# Patient Record
Sex: Male | Born: 1970 | Race: White | Hispanic: No | Marital: Single | Smoking: Current every day smoker
Health system: Southern US, Community
[De-identification: ages and names within clinical notes are randomized; demographics above are authoritative.]

## PROBLEM LIST (undated history)

## (undated) DIAGNOSIS — E119 Type 2 diabetes mellitus without complications: Secondary | ICD-10-CM

## (undated) DIAGNOSIS — I10 Essential (primary) hypertension: Secondary | ICD-10-CM

---

## 2010-07-20 ENCOUNTER — Ambulatory Visit: Payer: Self-pay | Admitting: Psychiatry

## 2010-07-20 ENCOUNTER — Inpatient Hospital Stay (HOSPITAL_COMMUNITY): Admission: AD | Admit: 2010-07-20 | Discharge: 2010-07-21 | Payer: Self-pay | Admitting: Psychiatry

## 2014-11-11 ENCOUNTER — Encounter (HOSPITAL_COMMUNITY): Payer: Self-pay | Admitting: Cardiology

## 2014-11-11 ENCOUNTER — Emergency Department (HOSPITAL_COMMUNITY)
Admission: EM | Admit: 2014-11-11 | Discharge: 2014-11-11 | Disposition: A | Discharge status: Home / Self Care | Payer: Self-pay | Attending: Emergency Medicine | Admitting: Emergency Medicine

## 2014-11-11 DIAGNOSIS — G479 Sleep disorder, unspecified: Secondary | ICD-10-CM | POA: Insufficient documentation

## 2014-11-11 DIAGNOSIS — Z720 Tobacco use: Secondary | ICD-10-CM | POA: Insufficient documentation

## 2014-11-11 DIAGNOSIS — K029 Dental caries, unspecified: Secondary | ICD-10-CM | POA: Insufficient documentation

## 2014-11-11 DIAGNOSIS — I10 Essential (primary) hypertension: Secondary | ICD-10-CM | POA: Insufficient documentation

## 2014-11-11 DIAGNOSIS — Z590 Homelessness: Secondary | ICD-10-CM | POA: Insufficient documentation

## 2014-11-11 DIAGNOSIS — E119 Type 2 diabetes mellitus without complications: Secondary | ICD-10-CM | POA: Insufficient documentation

## 2014-11-11 DIAGNOSIS — K0889 Other specified disorders of teeth and supporting structures: Secondary | ICD-10-CM

## 2014-11-11 DIAGNOSIS — K088 Other specified disorders of teeth and supporting structures: Secondary | ICD-10-CM | POA: Insufficient documentation

## 2014-11-11 HISTORY — DX: Essential (primary) hypertension: I10

## 2014-11-11 HISTORY — DX: Type 2 diabetes mellitus without complications: E11.9

## 2014-11-11 MED ORDER — AMOXICILLIN 500 MG PO CAPS: 500.0000 mg | ORAL_CAPSULE | Freq: Three times a day (TID) | ORAL | Status: DC

## 2014-11-11 MED ORDER — BIOTENE DRY MOUTH MT LIQD
15.0000 mL | OROMUCOSAL | Status: DC | PRN
  Administered 2014-11-11: 15 mL via OROMUCOSAL

## 2014-11-11 MED ORDER — IBUPROFEN 400 MG PO TABS
800.0000 mg | ORAL_TABLET | Freq: Once | ORAL | Status: AC
  Administered 2014-11-11: 800 mg via ORAL
  Filled 2014-11-11: qty 2

## 2014-11-11 MED ORDER — IBUPROFEN 600 MG PO TABS: 600.0000 mg | ORAL_TABLET | Freq: Four times a day (QID) | ORAL | Status: DC | PRN

## 2014-11-11 MED ORDER — AMOXICILLIN 500 MG PO CAPS
1000.0000 mg | ORAL_CAPSULE | Freq: Once | ORAL | Status: AC
  Administered 2014-11-11: 1000 mg via ORAL
  Filled 2014-11-11: qty 2

## 2014-11-11 NOTE — Discharge Instructions (Signed)
Dental Pain °A tooth ache may be caused by cavities (tooth decay). Cavities expose the nerve of the tooth to air and hot or cold temperatures. It may come from an infection or abscess (also called a boil or furuncle) around your tooth. It is also often caused by dental caries (tooth decay). This causes the pain you are having. °DIAGNOSIS  °Your caregiver can diagnose this problem by exam. °TREATMENT  °· If caused by an infection, it may be treated with medications which kill germs (antibiotics) and pain medications as prescribed by your caregiver. Take medications as directed. °· Only take over-the-counter or prescription medicines for pain, discomfort, or fever as directed by your caregiver. °· Whether the tooth ache today is caused by infection or dental disease, you should see your dentist as soon as possible for further care. °SEEK MEDICAL CARE IF: °The exam and treatment you received today has been provided on an emergency basis only. This is not a substitute for complete medical or dental care. If your problem worsens or new problems (symptoms) appear, and you are unable to meet with your dentist, call or return to this location. °SEEK IMMEDIATE MEDICAL CARE IF:  °· You have a fever. °· You develop redness and swelling of your face, jaw, or neck. °· You are unable to open your mouth. °· You have severe pain uncontrolled by pain medicine. °MAKE SURE YOU:  °· Understand these instructions. °· Will watch your condition. °· Will get help right away if you are not doing well or get worse. °Document Released: 09/29/2005 Document Revised: 12/22/2011 Document Reviewed: 05/17/2008 °ExitCare® Patient Information ©2015 [employer]. This information is not intended to replace advice given to you by your health care provider. Make sure you discuss any questions you have with your health care provider. ° ° °RESOURCE GUIDE ° °Chronic Pain Problems: °Contact [facility]  297-2271 °Patients need to be  referred by their primary care doctor. ° °Insufficient Money for Medicine: °Contact [employer] Way:  call "211" or [facility] 271-5999. ° °No Primary Care Doctor: °Call [facility]  832-8000 - can help you locate a primary care doctor that  accepts your insurance, provides certain services, etc. °Physician Referral Service- 1-800-533-3463 ° °Agencies that provide inexpensive medical care: °[facility]  832-8035 °[facility]  832-7272 °[facility]  [phone] °[facility]  832-4777 °[facility]anned Parenthood  373-0678 °[facility]  272-1050 ° °Medicaid-accepting [place] Providers: °[facility]- [address] ° 641-2100, Mon-Fri 9am-7pm, Sat 9am-1pm °[facility]- [address], [address] ° [phone] °[facility]- [address] ° [phone] °[facility]- [address] [place] [address] ° [phone] °[facility]- [address], [phone] ° Only accepts [place] Access Medicaid patients after they have their name  applied to their card ° °Self Pay (no insurance) in [place]: °Sickle Cell Patients: Dr Eric Dean, [facility] ° [address], [phone] °[facility]- [address] ° [phone] °      -      [facility]- [address] °      -     [facility]- see information above (Speak to Pam H if you do not have insurance) °      -  [facility]- [address], [phone] °      -  [facility] [place]- [address],  [phone] °      -  [facility]- [address] [place] [address], [phone] °      -  Dr Osei-Bonsu-  [address], [place], [phone] °      -  [facility]- [address], [phone] °      -  [facility]- [address] [place] [address], [phone], also [address], [phone] °      -     [facility]- [address], [phone], 1st & 3rd Saturday   every month, 10am-1pm ° °1) Find a Doctor and Pay Out of Pocket °Although you won't have to find out who is covered by your insurance plan, it is a good idea to ask around and get recommendations. You will then need to call the office and see if the doctor you have chosen will accept you as a new patient and what types of options they offer for patients who are self-pay. Some doctors offer discounts or will set up payment plans for their patients who do not have insurance, but you will need to ask so you aren't surprised when you get to your appointment. ° °2) Contact Your Local Health Department °Not all health departments have doctors that can see patients for sick visits, but many do, so it is worth a call to see if yours does. If you don't know where your local health department is, you can check in your phone book. The CDC also has a tool to help you locate your state's health department, and many state websites also have listings of all of their local health departments. ° °3) Find a Walk-in Clinic °If your illness is not likely to be very severe or complicated, you may want to try a walk in clinic. These are popping up all over the country in pharmacies, drugstores, and shopping centers. They're usually staffed by nurse practitioners or physician assistants that have been trained to treat common illnesses and complaints. They're usually fairly quick and inexpensive. However, if you have serious medical issues or chronic medical problems, these are probably not your best option ° °STD Testing °[place] [facility] [place], [facility], [address], [place], phone [phone] or 1-877-539-9860.  Monday - Friday, call for an appointment. °[place] [facility] [place], [facility], [address], [place], phone [phone] or 1-877-539-9860.  Monday - Friday, call for an  appointment. ° °Abuse/Neglect: °[place] Child Abuse Hotline (336) 641-3795 °[place] Child Abuse Hotline 800-378-5315 (After Hours) ° °Emergency Shelter:   [facility] (336) 271-5985 ° °Maternity Homes: °[facility] (336) 275-9566 °[facility] (704) 372-4663 ° °MRSA Hotline #:   832-7006 ° °[place] Resources ° °[facility] [place]  [employer] [place] [facility] °[address]                 [address]         [address]  °[place]                                               [place]                                [place] °Phone:  349-3220                                  Phone:  [phone]                   Phone:  342-8140 ° °[facility], 342-8316 °[place] [facility] - [facility]- 1-888-581-9988 °      -     [facility] in [place], [address],                                  336-349-4454, Insurance ° °[place] Child Abuse Hotline °(336) 342-1394 or (336) 342-3537 (After Hours) ° ° °Behavioral Health Services ° °Substance Abuse Resources: °[facility]  336-882-2125 °[facility] 336-784-9470 °[facility] 336-285-9073 °[facility] 336-845-3988 °Residential & Outpatient Substance Abuse Program  800-659-3381 ° °Psychological Services: ° Health  832-9600 °[facility]  378-7881 °[facility], [address], [place], ACCESS LINE: 1-800-853-5163 or 336-641-4981, Http://[email or web address]/adult.htm ° °Dental Assistance ° °If unable to pay or uninsured, contact:  [facility] or [facility] [facility] to become qualified for the adult dental clinic. ° °Patients with Medicaid: [facility] °[address], 632-0744 °[address], 510-2600 ° °If unable to pay, or uninsured, contact  [facility] (271-5999) or [place] [facility] (641-3152 in [place], 842-7733 in [place]) to become qualified for the adult dental clinic ° °Other Low-Cost Community Dental Services: °[facility]- [address], [place], [place], [address], 723-1848, [address]. [address], 2nd and 4th Thursday of the month at 6:30am.  10 clients each day by appointment, can sometimes see walk-in patients if someone does not show for an appointment. °[facility]- [address], [place], [place], [address], 723-7904 °[facility]- [address], [place], [place], [address], 631-2330 °[facility]- 342-8273 °[facility]- 703-3100 °[facility]- 570-6415 ° °Please make every effort to establish with a primary care physician for routine medical care ° °Adult Health Services  °The [place] [facility] provides a wide range of adult health services. Some of these services are designed to address the healthcare needs of all [place] residents and all services are designed to meet the needs of uninsured/underinsured low income residents. Some services are available to any resident of [place], call [phone] for details. °] °[facility], a new medical clinic for adults, is now open. For more information about the Center and its services please call [phone]. °For information on our [facility] services, click here. ° °For more information on any of the following Department of Public Health programs, including hours of service, click on the highlighted link. ° °SERVICES FOR WOMEN (Adults and Teens) °Family Planning Services provide a full range of birth control options plus education and counseling. New patient visit and annual return visits include a complete examination, pap test as indicated, and other laboratory as indicated. Included is our Regional Vasectomy Program  for men. ° °Maternity Care is provided through pregnancy, including a six week post partum exam. Women who meet eligibility criteria for the Medicaid for Pregnant Women program, receive care free. Other women are charged on a sliding scale according to income. °Note: Our   Dental Clinic provides services to pregnant women who have a Medicaid card. Call [phone] for an appointment in [place] or [phone] for an appointment in [place]. ° °Primary Care for Medicaid [place] Access Women is available through the [place] [facility]. As primary care provider for the [place] Medicaid  Access Medicaid Managed Care program, women may designate the Women’s Health clinic as their primary care provider. ° °PLEASE CALL 641-3245 FOR AN APPOINTMENT FOR THE ABOVE SERVICES IN EITHER [place] OR [place]. Information available in English and Spanish.  ° °Childbirth Education Classes are open to the public and offered to help families prepare for the best possible childbirth experience as well as to promote lifelong health and wellness. Classes are offered throughout the year and meet on the same night once a week for five weeks. Medicaid covers the cost of the classes for the mother-to-be and her partner. For participants without Medicaid, the cost of the class series is $45.00 for the mother-to-be and her partner. Class size is limited and registration is required. For more information or to register call 336-641-4718. Baby items donated by [employer] and the [employer] are given away during each class series. ° °SERVICES FOR WOMEN AND MEN °Sexually Transmitted Infection appointments, including HIV testing, are available daily (weekdays, except holidays). Call early as same-day appointments are limited. For an appointment in either [place] or [place], call [phone]. Services are confidential and free of charge. ° °Skin Testing for Tuberculosis Please call  [phone]. °Adult Immunizations are available, usually for a fee. Please call [phone] for details. ° °PLEASE CALL [phone] FOR AN APPOINTMENT FOR THE ABOVE SERVICES IN EITHER [place] OR [place].  ° °[facility] provides up to the minute recommended vaccines for your travel destination. We also provide essential health and political information to help insure a safe and pleasurable travel experience. This program is self-sustaining, however, fees are very competitive. We are a CERTIFIED YELLOW FEVER IMMUNIZATION approved clinic site. °PLEASE CALL [phone] FOR AN APPOINTMENT IN EITHER [place] OR [place].  ° °If you have questions about the services listed above, we want to answer them! Email us at: [email or web address] °Home Visiting Services for elderly and the disabled are available to residents of [place] who are in need of care that compares to the care offered by a nursing home, have needs that can be met by the program, and have [place] Medicaid. Other short term services are available to residents 18 years and older who are unable to meet requirements for eligibility to receive services from a certified home health agency, spend the majority of time at home, and need care for six months or less. ° °PLEASE CALL [phone] OR [phone] FOR MORE INFORMATION. °Medication Assistance Program serves as a link between pharmaceutical companies and patients to provide low cost or free prescription medications. This servce is available for residents who meet certain income restrictions and have no insurance coverage. ° °PLEASE CALL 641-8030 ([place]) OR [phone] ([place]) FOR MORE INFORMATION.  °Updated Feb. 21, 2013 ° ° °

## 2014-11-11 NOTE — ED Notes (Signed)
Pt reports right sided dental pain for the past 2 days. States that he noticed some drainage from it this afternoon.

## 2014-11-11 NOTE — ED Provider Notes (Signed)
CSN: [id number]     Arrival date & time 11/11/14  1811 History  This chart was scribed for non-physician practitioner, Duane Greenhaw Irine Seal PA-C, working with Duane Jester, DO by Duane Bentley, ED Scribe. This patient was seen in room TR06C/TR06C and the patient's care was started at 6:37 PM.    Chief Complaint  Patient presents with  . Dental Pain    The history is provided by the patient. No language interpreter was used.     HPI Comments:  Duane Bentley is a 44 y.o. male who presents to the Emergency Department complaining of "10/10" achy constant right lower dental pain. He reports white and bloody drainage from the site today after rubbing his jaw. He also reports trouble sleeping due to pain. No alleviating factors or associated symptoms noted.. Symptoms began 2 days ago. The patient has tried to alleviate pain with no medication because he is homeless.  Pain rated at a 10/10, characterized as throbbing in nature and located right lower quadrant. Patient denies fever, night sweats, chills, difficulty swallowing or opening mouth, SOB, nuchal rigidity or decreased ROM of neck.  Patient does not have a dentist and requests a resource guide at discharge.   Past Medical History  Diagnosis Date  . Diabetes mellitus without complication   . Hypertension    History reviewed. No pertinent past surgical history. History reviewed. No pertinent family history. History  Substance Use Topics  . Smoking status: Current Every Day Smoker -- 2.00 packs/day  . Smokeless tobacco: Not on file  . Alcohol Use: Yes    Review of Systems  Constitutional: Negative for fever and chills.  HENT: Positive for dental problem.   All other systems reviewed and are negative.   Allergies  Review of patient's allergies indicates no known allergies.  Home Medications   Prior to Admission medications   Medication Sig Start Date End Date Taking? Authorizing Provider  amoxicillin (AMOXIL) 500 MG capsule Take  1 capsule (500 mg total) by mouth 3 (three) times daily. 11/11/14   Duane Bentley Irine Seal, PA-C  ibuprofen (ADVIL,MOTRIN) 600 MG tablet Take 1 tablet (600 mg total) by mouth every 6 (six) hours as needed. 11/11/14   Duane Bentley Irine Seal, PA-C   BP 170/80 mmHg  Pulse 82  Temp(Src) 97.7 F (36.5 C) (Oral)  Resp 18  SpO2 99% Physical Exam  Constitutional: He is oriented to person, place, and time. He appears well-developed and well-nourished.  HENT:  Head: Normocephalic and atraumatic.  Mouth/Throat: Oropharynx is clear and moist. No oral lesions. No trismus in the jaw. Dental abscesses (open and has drained) and dental caries present. No uvula swelling.  No cellulitis or signs of tracking. No swelling pain on sublingual finger sweep. + widespread dental decay.  Eyes: Conjunctivae and EOM are normal. Pupils are equal, round, and reactive to light.  Neck: Normal range of motion. Neck supple. No spinous process tenderness and no muscular tenderness present.  Cardiovascular: Normal rate and regular rhythm.   Pulmonary/Chest: Effort normal and breath sounds normal.  Abdominal: He exhibits no distension.  Neurological: He is alert and oriented to person, place, and time.  Skin: Skin is warm and dry.  Psychiatric: He has a normal mood and affect.  Nursing note and vitals reviewed.   ED Course  Procedures   DIAGNOSTIC STUDIES:  Oxygen Saturation is 99% on RA, normal by my interpretation.    COORDINATION OF CARE:  6:39 PM Will perform a dental block. Discussed treatment plan with pt  at bedside and pt agreed to plan. 6:43 PM Pt notes pain has improved at this time after dental block/. He is homeless, therefore given first dose of abx and pain meds in the ED. Also, given rx for amoxicillin and 800 mg Ibuprofen so that he can go to [facility] to fill medications.   Labs Review Labs Reviewed - No data to display  Imaging Review No results found.   EKG Interpretation None      MDM   Final  diagnoses:  Toothache    NERVE BLOCK Date/Time: 6:58 pm Performed by: Duane Bentley, Duane Bentley Authorized by: Duane Bentley, Duane Bentley Consent: Verbal consent obtained. Risks and benefits: risks, benefits and alternatives were discussed Consent given by: patient Indications: pain relief Body area: face/mouth Laterality: left Needle gauge: 25 Bentley Local anesthetic: lidocaine 2% without epinephrine Anesthetic total: 2 ml Outcome: pain improved Patient tolerance: Patient tolerated the procedure well with no immediate complications. Comments: Patient had complete relief of pain.  Patient has dental pain. No emergent s/sx's present. Patent airway. No trismus.  Will be given pain medication and antibiotics. I discussed the need to call dentist within 24/48 hours for follow-up. Dental referral given. Return to ED precautions given.  Pt voiced understanding and has agreed to follow-up.   44 y.o.Duane Bentley's evaluation in the Emergency Department is complete. It has been determined that no acute conditions requiring further emergency intervention are present at this time. The patient/guardian have been advised of the diagnosis and plan. We have discussed signs and symptoms that warrant return to the ED, such as changes or worsening in symptoms.  Vital signs are stable at discharge. Filed Vitals:   11/11/14 1816  BP: 170/80  Pulse: 82  Temp: 97.7 F (36.5 C)  Resp: 18    Patient/guardian has voiced understanding and agreed to follow-up with the PCP or specialist.  I personally performed the services described in this documentation, which was scribed in my presence. The recorded information has been reviewed and is accurate.     Duane Matasiffany Bentley Ramondo Dietze, PA-C 11/11/14 1859  Duane JesterKathleen McManus, DO 11/13/14 1413

## 2015-03-15 ENCOUNTER — Emergency Department (HOSPITAL_COMMUNITY)
Admission: EM | Admit: 2015-03-15 | Discharge: 2015-03-15 | Disposition: A | Discharge status: Home / Self Care | Payer: Self-pay | Attending: Emergency Medicine | Admitting: Emergency Medicine

## 2015-03-15 ENCOUNTER — Encounter (HOSPITAL_COMMUNITY): Payer: Self-pay | Admitting: Emergency Medicine

## 2015-03-15 DIAGNOSIS — E119 Type 2 diabetes mellitus without complications: Secondary | ICD-10-CM | POA: Insufficient documentation

## 2015-03-15 DIAGNOSIS — K047 Periapical abscess without sinus: Secondary | ICD-10-CM | POA: Insufficient documentation

## 2015-03-15 DIAGNOSIS — Z720 Tobacco use: Secondary | ICD-10-CM | POA: Insufficient documentation

## 2015-03-15 DIAGNOSIS — I10 Essential (primary) hypertension: Secondary | ICD-10-CM | POA: Insufficient documentation

## 2015-03-15 DIAGNOSIS — Z792 Long term (current) use of antibiotics: Secondary | ICD-10-CM | POA: Insufficient documentation

## 2015-03-15 MED ORDER — IBUPROFEN 800 MG PO TABS: 800.0000 mg | ORAL_TABLET | Freq: Three times a day (TID) | ORAL | Status: AC

## 2015-03-15 MED ORDER — AMOXICILLIN 500 MG PO CAPS: 500.0000 mg | ORAL_CAPSULE | Freq: Three times a day (TID) | ORAL | Status: DC

## 2015-03-15 MED ORDER — HYDROCODONE-ACETAMINOPHEN 5-325 MG PO TABS: 2.0000 | ORAL_TABLET | ORAL | Status: AC | PRN

## 2015-03-15 MED ORDER — IBUPROFEN 600 MG PO TABS: 600.0000 mg | ORAL_TABLET | Freq: Four times a day (QID) | ORAL | Status: AC | PRN

## 2015-03-15 NOTE — ED Notes (Signed)
Social worker has been contacted.

## 2015-03-15 NOTE — ED Notes (Signed)
Pt c/o left upper dental pain. Pt is homeless. Has not been able to get his BP meds x 1 month. BP elevated.

## 2015-03-15 NOTE — Discharge Instructions (Signed)
Dental Abscess °A dental abscess is a collection of infected fluid (pus) from a bacterial infection in the inner part of the tooth (pulp). It usually occurs at the end of the tooth's root.  °CAUSES  °· Severe tooth decay. °· Trauma to the tooth that allows bacteria to enter into the pulp, such as a broken or chipped tooth. °SYMPTOMS  °· Severe pain in and around the infected tooth. °· Swelling and redness around the abscessed tooth or in the mouth or face. °· Tenderness. °· Pus drainage. °· Bad breath. °· Bitter taste in the mouth. °· Difficulty swallowing. °· Difficulty opening the mouth. °· Nausea. °· Vomiting. °· Chills. °· Swollen neck glands. °DIAGNOSIS  °· A medical and dental history will be taken. °· An examination will be performed by tapping on the abscessed tooth. °· X-rays may be taken of the tooth to identify the abscess. °TREATMENT °The goal of treatment is to eliminate the infection. You may be prescribed antibiotic medicine to stop the infection from spreading. A root canal may be performed to save the tooth. If the tooth cannot be saved, it may be pulled (extracted) and the abscess may be drained.  °HOME CARE INSTRUCTIONS °· Only take over-the-counter or prescription medicines for pain, fever, or discomfort as directed by your caregiver. °· Rinse your mouth (gargle) often with salt water (¼ tsp salt in 8 oz [250 ml] of warm water) to relieve pain or swelling. °· Do not drive after taking pain medicine (narcotics). °· Do not apply heat to the outside of your face. °· Return to your dentist for further treatment as directed. °SEEK MEDICAL CARE IF: °· Your pain is not helped by medicine. °· Your pain is getting worse instead of better. °SEEK IMMEDIATE MEDICAL CARE IF: °· You have a fever or persistent symptoms for more than 2-3 days. °· You have a fever and your symptoms suddenly get worse. °· You have chills or a very bad headache. °· You have problems breathing or swallowing. °· You have trouble  opening your mouth. °· You have swelling in the neck or around the eye. °Document Released: 09/29/2005 Document Revised: 06/23/2012 Document Reviewed: 01/07/2011 °ExitCare® Patient Information ©2015 [employer]. This information is not intended to replace advice given to you by your health care provider. Make sure you discuss any questions you have with your health care provider. ° ° °Emergency Department Resource Guide °1) Find a Doctor and Pay Out of Pocket °Although you won't have to find out who is covered by your insurance plan, it is a good idea to ask around and get recommendations. You will then need to call the office and see if the doctor you have chosen will accept you as a new patient and what types of options they offer for patients who are self-pay. Some doctors offer discounts or will set up payment plans for their patients who do not have insurance, but you will need to ask so you aren't surprised when you get to your appointment. ° °2) Contact Your Local Health Department °Not all health departments have doctors that can see patients for sick visits, but many do, so it is worth a call to see if yours does. If you don't know where your local health department is, you can check in your phone book. The CDC also has a tool to help you locate your state's health department, and many state websites also have listings of all of their local health departments. ° °3) Find a Walk-in   Clinic °If your illness is not likely to be very severe or complicated, you may want to try a walk in clinic. These are popping up all over the country in pharmacies, drugstores, and shopping centers. They're usually staffed by nurse practitioners or physician assistants that have been trained to treat common illnesses and complaints. They're usually fairly quick and inexpensive. However, if you have serious medical issues or chronic medical problems, these are probably not your best option. ° °No Primary Care Doctor: °- Call  Health Connect at  832-8000 - they can help you locate a primary care doctor that  accepts your insurance, provides certain services, etc. °- Physician Referral Service- 1-800-533-3463 ° °Chronic Pain Problems: °Organization         Address  Phone   Notes  °[facility]  (336) 297-2271 Patients need to be referred by their primary care doctor.  ° °Medication Assistance: °Organization         Address  Phone   Notes  °[place] [facility] [address] °[place], [place] [address] (336) 641-8030 --Must be a resident of [place] °-- Must have NO insurance coverage whatsoever (no Medicaid/ Medicare, etc.) °-- The pt. MUST have a primary care doctor that directs their care regularly and follows them in the community °  °[facility]  (866) 331-1348   °[employer]  (888) 892-1162   ° °Agencies that provide inexpensive medical care: °Organization         Address  Phone   Notes  °[facility]  (336) 832-8035   °[facility]    (336) 832-7272   °[facility] [address] °[place], [place] [address] (336) 832-4777   °[facility] [address], °[place] (336) 271-4999   °[facility]    (336) 373-0678   °[facility]    (336) 272-1050   °[facility] ° [address], [place] Phone:  (336) 832-4444, Fax:  (336) 832-4440 Hours of Operation:  9 am - 6 pm, M-F.  Also accepts Medicaid/Medicare and self-pay.  °[facility] ° [address], [place] Phone: (336) 832-3150, Fax: (336) 832-3151. Hours of Operation:  8:30 am - 5:30 pm, M-F.  Also accepts Medicaid and self-pay.  °[facility] [place] [address], [place] Phone: (336) 878-6027   °[facility] [address], [place], [place] (336)723-1848, Ext. 123 Mondays & Thursdays: 7-9 AM.  First 15 patients are seen on a first come, first serve  basis. °  ° °Medicaid-accepting [place] Providers: ° °Organization         Address  Phone   Notes  °[facility] [address], [place] (336) 641-2100 Also accepts self-pay patients.  °[facility] [address], [place] ° (336) 856-9996   °[facility] [address], [place] (336) 288-8857   °[facility] [address] [place] [address], [place] (336) 299-7000   °Veita Bland [address], [place]  ° (336) 373-1557 Only accepts [place] Access Medicaid patients after they have their name applied to their card.  ° °Self-Pay (no insurance) in [place]: ° °Organization         Address  Phone   Notes  °Sickle Cell Patients, [facility] [address],  (  336) 832-1970   °[facility] [address], [place] (336) 832-4400   °[facility] ° [address], [place] (336) 992-4800   °[facility]/Dr. Osei-Bonsu ° [address] [place] [address], [place] or [address], [place] (336) 841-8500 Phone number for both [place] and [place] locations is the same.  °[facility] [address], [place] (336) 299-0000   °[facility] [address] [place] [address], [place] or [address] (336) 852-7530 °(336) 878-2260   °[facility] [address], [place] (336) 350-1642, phone; (336) 294-5005, fax Sees patients 1st and 3rd Saturday of every month.  Must not qualify for public or private insurance (i.e. Medicaid, Medicare, [place] Health Choice, Veterans' Benefits) • Household income should be no more than 200% of the poverty level •The clinic cannot treat you if you are pregnant or think you are pregnant • Sexually transmitted diseases are not treated at the clinic.  ° ° °Dental Care: °Organization         Address  Phone  Notes  °[place]  [facility] [address], [place] (336) 641-6152 Accepts children up to age 21 who are enrolled in Medicaid or [place] Health Choice; pregnant women with a Medicaid card; and children who have applied for Medicaid or [place] Health Choice, but were declined, whose parents can pay a reduced fee at time of service.  °[facility] [place]  [address], [place] (336) 641-7733 Accepts children up to age 21 who are enrolled in Medicaid or [place] Health Choice; pregnant women with a Medicaid card; and children who have applied for Medicaid or [place] Health Choice, but were declined, whose parents can pay a reduced fee at time of service.  °[facility] ° [address], [place] (336) 641-4533 Patients are seen by appointment only. Walk-ins are not accepted. [facility] will see patients 18 years of age and older. °Monday - Tuesday (8am-5pm) °Most Wednesdays (8:30-5pm) °$30 per visit, cash only  °[facility] ° [address], [place] (336) 641-4533 Patients are seen by appointment only. Walk-ins are not accepted. [facility] will see patients 18 years of age and older. °One Wednesday Evening (Monthly: Volunteer Based).  $30 per visit, cash only  °[facility]  (919) 537-3737 for adults; Children under age 4, call Graduate Pediatric Dentistry at (919) [phone]. Children aged 4-14, please call (919) [phone] to request a pediatric application. ° Dental services are provided in all areas of dental care including fillings, crowns and bridges, complete and partial dentures, implants, gum treatment, root canals, and extractions. Preventive care is also provided. Treatment is provided to both adults and children. °Patients are selected via a lottery and there is often a waiting list. °  °[facility] [address], °[place] ° (336) 763-8833  [email or web address] °  °[facility] [address], [place], [place] (336)723-1848, Ext. 123 Second and Fourth Thursday of each month, opens at 6:30 AM; Clinic ends at 9 AM.  Patients are seen on a first-come first-served basis, and a limited number are seen during each clinic.  ° °[facility] ° [address], [place], [place] (336) 723-7904   Eligibility Requirements °You must have lived in [place], [place], or [place] counties   for at least the last three months. °  You cannot be eligible for state or federal sponsored healthcare insurance, including Veterans Administration, Medicaid, or Medicare. °  You generally cannot be eligible for healthcare insurance through your employer.  °  How to apply: °Eligibility screenings are held every Tuesday and Wednesday afternoon from 1:00 pm until 4:00 pm. You do not need an appointment for the interview!  °[facility] [address], [place], [place] 336-631-2330   °[facility] [facility]  336-342-8273   °[place]  336-703-3100   ° [facility]  336-570-6415   ° °Behavioral Health Resources in the Community: °Intensive Outpatient Programs °Organization         Address  Phone  Notes  °[facility] [address], [place], [place] 336-878-6098   °[facility] [address], [place], [place] 336-832-9800   °[facility] [address], [place], [place] ° 336-882-2125   °[facility] [address],  °[place], [place] 1-800-853-5163 or 336-641-4981   °Substance Abuse Resources °Organization         Address  Phone  Notes  °[facility]  336-882-2125   °[facility]  336-784-9470   °[facility]  336-285-9073   °[facility]  336-845-3988   °Residential & Outpatient Substance Abuse Program  1-800-659-3381   °Psychological Services °Organization          Address  Phone  Notes  °[facility]  336- 832-9600   °[facility]  336- 378-7881   °[facility] [address], [place] 1-800-853-5163 or 336-641-4981   ° °Mobile Crisis Teams °Organization         Address  Phone  Notes  °[facility], Mobile Crisis Care Unit  1-877-626-1772   °Assertive °[facility] ° [address] [place], [place] 336-834-9664   °Sharon DeEsch [address] °[place] 336-554-5454   ° °Self-Help/Support Groups °Organization         Address  Phone             Notes  °[facility] - variety of support groups  336- 373-1402 Call for more information  °Narcotics Anonymous (NA), [facility] [address], °[place]  2 meetings at this location  ° °Residential Treatment Programs °Organization         Address  Phone  Notes  °[facility] [address],    °[place]  1-866-801-8205   °[facility] ° [address], [place], [place] 704-293-8524   °[facility] [address], [place] 336-845-3988 Admissions: 8am-3pm M-F  °[facility] [address]    °[place], [place] 336-841-1104   °[facility] [address], [place], [place] 336-379-7146   °[facility] [address]  °[place], [place] 336-285-9073   °[facility] - Intensive Outpatient [address], [place], [place] 336-852-3033   °[facility] [address]  °[place], [place] 1-877-615-2722 or 336-784-9470   °[facility] [address] [place], [place] 336-227-7417 Accepts Medicaid  °[facility] [address]  °[place]  1-800-659-3381 Substance Abuse/Addiction Treatment  ° °[place] Behavioral Health Resources °Organization         Address  Phone  Notes  °[facility]  (888)   581-9988   °Julie Brannon, PhD [address] [employer], [place]   (336) 349-5553 or (336) 951-0000   °[facility]   [address] °[place], [place] (336) 349-4454   °[facility] [address], [place], [place] (336) 342-8316 Insurance/Medicaid/sponsorship through [facility]  °[facility] [address]                                    [place], [place] (336) 342-8316 Therapy/tele-psych/case  °[facility] [address]  ° [place], [place] (336) 349-2233    °Dr. Arfeen  (336) 349-4544   °[facility] [place]  [employer] [place] [facility] 1) [address], [place] °2) [address], [place] °3)  [address], [place] (336) 349-3220 °(336) 342-7768 ° °(336) 342-8140   °[place] [facility] (336) 342-1394 or (336) 342-3537 (After Hours)    ° ° ° °

## 2015-03-15 NOTE — ED Provider Notes (Signed)
CSN: [id number]     Arrival date & time 03/15/15  1025 History  This chart was scribed for non-physician practitioner, Lonia SkinnerLeslie K. Keenan BachelorSofia, PA-C working with Jerelyn ScottMartha Linker, MD by Doreatha MartinEva Mathews, ED scribe. This patient was seen in room TR07C/TR07C and the patient's care was started at 11:00 AM    Chief Complaint  Patient presents with  . Dental Pain   The history is provided by the patient. No language interpreter was used.    HPI Comments: Duane Bentley is a 44 y.o. male with Hx of DM and HTN who presents to the Emergency Department complaining of moderate, gradually worsening left upper dental pain onset one month ago and worsened yesterday. He reports associated swelling. Pt states that a piece of his tooth broke off into his gum. He states that he does not have a dentist and he is homeless. Pt reports that he has not taken his BP medicine in one month and is followed by [facility]. NKDA.   Past Medical History  Diagnosis Date  . Diabetes mellitus without complication   . Hypertension    History reviewed. No pertinent past surgical history. No family history on file. History  Substance Use Topics  . Smoking status: Current Every Day Smoker -- 2.00 packs/day  . Smokeless tobacco: Not on file  . Alcohol Use: Yes    Review of Systems  HENT: Positive for dental problem.   All other systems reviewed and are negative.  Allergies  Review of patient's allergies indicates no known allergies.  Home Medications   Prior to Admission medications   Medication Sig Start Date End Date Taking? Authorizing Provider  amoxicillin (AMOXIL) 500 MG capsule Take 1 capsule (500 mg total) by mouth 3 (three) times daily. 11/11/14   Tiffany Neva SeatGreene, PA-C  ibuprofen (ADVIL,MOTRIN) 600 MG tablet Take 1 tablet (600 mg total) by mouth every 6 (six) hours as needed. 11/11/14   Marlon Peliffany Greene, PA-C   Triage VS: BP 155/118 mmHg  Pulse 69  Temp(Src) 97.9 F (36.6 C) (Oral)  Resp 18  Ht 5\' 11"   (1.803 m)  Wt 280 lb (127.007 kg)  BMI 39.07 kg/m2  SpO2 98% Physical Exam  Constitutional: He is oriented to person, place, and time. He appears well-developed and well-nourished. No distress.  HENT:  Head: Normocephalic and atraumatic.  Mouth/Throat: Oropharynx is clear and moist.  Large area of decay on the 1st molar, left upper gum.   Eyes: Conjunctivae and EOM are normal. Pupils are equal, round, and reactive to light.  Neck: Normal range of motion. Neck supple. No tracheal deviation present.  Cardiovascular: Normal rate and normal heart sounds.  Exam reveals no gallop and no friction rub.   No murmur heard. Pulmonary/Chest: Breath sounds normal. No respiratory distress.  Abdominal: Soft. He exhibits no distension. There is no tenderness. There is no rebound and no guarding.  Musculoskeletal: Normal range of motion. He exhibits no edema or tenderness.  Neurological: He is alert and oriented to person, place, and time.  Skin: Skin is warm and dry.  Psychiatric: He has a normal mood and affect. His behavior is normal.  Nursing note and vitals reviewed.   ED Course  Procedures (including critical care time) DIAGNOSTIC STUDIES: Oxygen Saturation is 98% on RA, normal by my interpretation.    COORDINATION OF CARE: 11:06 AM Discussed treatment plan with pt at bedside and pt agreed to plan.   Labs Review Labs Reviewed - No data to display  Imaging Review  No results found.   EKG Interpretation None     MDM   Final diagnoses:  Dental abscess  Social worker discussed getting an MD and dentist. Rx's filled amoxicillian hydrocodone  [name], PA-C 03/15/15 1305  [name], MD 03/15/15 1328

## 2015-03-15 NOTE — Discharge Planning (Signed)
NCM consulted for dental resources.  NCM provided list of dental offices, [facility] and health dept number to access for dental services.

## 2015-03-15 NOTE — ED Notes (Signed)
Waiting for SW 

## 2015-03-15 NOTE — ED Notes (Signed)
SW in.

## 2015-11-06 ENCOUNTER — Emergency Department (HOSPITAL_COMMUNITY)
Admission: EM | Admit: 2015-11-06 | Discharge: 2015-11-06 | Disposition: A | Discharge status: Home / Self Care | Payer: Self-pay | Attending: Emergency Medicine | Admitting: Emergency Medicine

## 2015-11-06 ENCOUNTER — Encounter (HOSPITAL_COMMUNITY): Payer: Self-pay | Admitting: Emergency Medicine

## 2015-11-06 ENCOUNTER — Observation Stay (HOSPITAL_COMMUNITY)
Admission: AD | Admit: 2015-11-06 | Payer: Federal, State, Local not specified - Other | Source: Intra-hospital | Admitting: Psychiatry

## 2015-11-06 DIAGNOSIS — F17200 Nicotine dependence, unspecified, uncomplicated: Secondary | ICD-10-CM | POA: Insufficient documentation

## 2015-11-06 DIAGNOSIS — G8929 Other chronic pain: Secondary | ICD-10-CM | POA: Insufficient documentation

## 2015-11-06 DIAGNOSIS — Z79899 Other long term (current) drug therapy: Secondary | ICD-10-CM | POA: Insufficient documentation

## 2015-11-06 DIAGNOSIS — E119 Type 2 diabetes mellitus without complications: Secondary | ICD-10-CM | POA: Insufficient documentation

## 2015-11-06 DIAGNOSIS — I10 Essential (primary) hypertension: Secondary | ICD-10-CM | POA: Insufficient documentation

## 2015-11-06 LAB — COMPREHENSIVE METABOLIC PANEL
ALK PHOS: 83 U/L (ref 38–126)
ALT: 21 U/L (ref 17–63)
ANION GAP: 9 (ref 5–15)
AST: 21 U/L (ref 15–41)
Albumin: 3.7 g/dL (ref 3.5–5.0)
BUN: 8 mg/dL (ref 6–20)
CHLORIDE: 105 mmol/L (ref 101–111)
CO2: 26 mmol/L (ref 22–32)
Calcium: 10.6 mg/dL — ABNORMAL HIGH (ref 8.9–10.3)
Creatinine, Ser: 0.71 mg/dL (ref 0.61–1.24)
GFR calc non Af Amer: >60 mL/min (ref >60)
Glucose, Bld: 143 mg/dL — ABNORMAL HIGH (ref 65–99)
POTASSIUM: 4.2 mmol/L (ref 3.5–5.1)
SODIUM: 140 mmol/L (ref 135–145)
Total Bilirubin: 0.4 mg/dL (ref 0.3–1.2)
Total Protein: 6.5 g/dL (ref 6.5–8.1)

## 2015-11-06 LAB — CBC
HCT: 43.2 % (ref 39.0–52.0)
Hemoglobin: 15.3 g/dL (ref 13.0–17.0)
MCH: 32 pg (ref 26.0–34.0)
MCHC: 35.4 g/dL (ref 30.0–36.0)
MCV: 90.4 fL (ref 78.0–100.0)
PLATELETS: 357 10*3/uL (ref 150–400)
RBC: 4.78 MIL/uL (ref 4.22–5.81)
RDW: 12.4 % (ref 11.5–15.5)
WBC: 8.7 10*3/uL (ref 4.0–10.5)

## 2015-11-06 LAB — RAPID URINE DRUG SCREEN, HOSP PERFORMED
AMPHETAMINES: NOT DETECTED
BENZODIAZEPINES: NOT DETECTED
Barbiturates: NOT DETECTED
Cocaine: NOT DETECTED
Opiates: NOT DETECTED
Tetrahydrocannabinol: NOT DETECTED

## 2015-11-06 LAB — SALICYLATE LEVEL: Salicylate Lvl: <4 mg/dL (ref 2.8–30.0)

## 2015-11-06 LAB — ETHANOL

## 2015-11-06 LAB — ACETAMINOPHEN LEVEL

## 2015-11-06 MED ORDER — GABAPENTIN 300 MG PO CAPS: 1200.0000 mg | ORAL_CAPSULE | Freq: Three times a day (TID) | ORAL | Status: AC

## 2015-11-06 MED ORDER — HYDROMORPHONE HCL 1 MG/ML IJ SOLN
2.0000 mg | Freq: Once | INTRAMUSCULAR | Status: AC
  Administered 2015-11-06: 2 mg via INTRAMUSCULAR
  Filled 2015-11-06: qty 2

## 2015-11-06 NOTE — Progress Notes (Signed)
Discussed patient case with Marcelle Smiling counselor in detail. The patient is currently denying any suicidal ideation and requesting discharge. Patient per discussion expressed frustration over not being prescribed any opiates for chronic pain. He reported having a bed at a shelter to meet his housing needs and is able to contract for his safety. Is determined to be psychiatrically clear for discharge from the [facility]. Patient would benefit from having his medical problems such as chronic pain addressed by a Primary Care Provider as it appears to be the central factor in his presentation.

## 2015-11-06 NOTE — ED Notes (Signed)
Pt sts he "is tired of living in pain" and "just needs some help" endorses SI, denies HI, is calm, alert and cooperative

## 2015-11-06 NOTE — ED Notes (Signed)
TTS monitor at bedside 

## 2015-11-06 NOTE — ED Notes (Signed)
BH states okay for discharge with contract for safety. MD made aware
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
## 2015-11-06 NOTE — ED Notes (Signed)
Patient signed contract for safety.

## 2015-11-06 NOTE — ED Notes (Signed)
Ordered a meal Tray.

## 2015-11-06 NOTE — ED Notes (Signed)
TTS in process 

## 2015-11-06 NOTE — Discharge Instructions (Signed)

## 2015-11-06 NOTE — ED Notes (Signed)
Spoke with Patient about transfer to [facility]. Patient states he does not want to stay, states all he wanted was a prescription for pain medication. Patient stated you are not doing anything to help. Patient advised he was given an appointment with the pain clinic and that he was signed up for the orange card

## 2015-11-06 NOTE — ED Provider Notes (Signed)
CSN: [id number]     Arrival date & time 11/06/15  7829 History   First MD Initiated Contact with Patient 11/06/15 0919     Chief Complaint  Patient presents with  . Psychiatric Evaluation     HPI Pt sts he "is tired of living in pain" and "just needs some help" endorses SI, denies HI, is calm, alert and cooperative Past Medical History  Diagnosis Date  . Diabetes mellitus without complication (HCC)   . Hypertension    History reviewed. No pertinent past surgical history. No family history on file. Social History  Substance Use Topics  . Smoking status: Current Every Day Smoker -- 2.00 packs/day  . Smokeless tobacco: None  . Alcohol Use: Yes    Review of Systems  Psychiatric/Behavioral: Positive for suicidal ideas.  All other systems reviewed and are negative.     Allergies  Review of patient's allergies indicates no known allergies.  Home Medications   Prior to Admission medications   Medication Sig Start Date End Date Taking? Authorizing Provider  ibuprofen (ADVIL,MOTRIN) 800 MG tablet Take 1 tablet (800 mg total) by mouth 3 (three) times daily. 03/15/15  Yes Lonia Skinner Sofia, PA-C  lisinopril-hydrochlorothiazide (PRINZIDE,ZESTORETIC) 20-12.5 MG tablet Take 0.5 tablets by mouth daily.   Yes Historical Provider, MD  metFORMIN (GLUCOPHAGE) 1000 MG tablet Take 1,000 mg by mouth 2 (two) times daily with a meal.   Yes Historical Provider, MD  gabapentin (NEURONTIN) 300 MG capsule Take 4 capsules (1,200 mg total) by mouth 3 (three) times daily. 11/06/15   Nelva Nay, MD  HYDROcodone-acetaminophen (NORCO/VICODIN) 5-325 MG per tablet Take 2 tablets by mouth every 4 (four) hours as needed. Patient not taking: Reported on 11/06/2015 03/15/15   [name], PA-C  ibuprofen (ADVIL,MOTRIN) 600 MG tablet Take 1 tablet (600 mg total) by mouth every 6 (six) hours as needed. Patient not taking: Reported on 11/06/2015 03/15/15   [name], PA-C   BP 133/89 mmHg  Pulse 87  Temp(Src)  98.2 F (36.8 C)  Resp 16  Ht 6' (1.829 m)  Wt 250 lb (113.399 kg)  BMI 33.90 kg/m2  SpO2 98% Physical Exam  Constitutional: He is oriented to person, place, and time. He appears well-developed and well-nourished. No distress.  HENT:  Head: Normocephalic and atraumatic.  Eyes: Pupils are equal, round, and reactive to light.  Neck: Normal range of motion.  Cardiovascular: Normal rate and intact distal pulses.   Pulmonary/Chest: No respiratory distress.  Abdominal: Normal appearance. He exhibits no distension.  Musculoskeletal: Normal range of motion.  Neurological: He is alert and oriented to person, place, and time. No cranial nerve deficit.  Skin: Skin is warm and dry. No rash noted.  Psychiatric: He has a normal mood and affect. His speech is normal and behavior is normal. He expresses suicidal ideation.  Nursing note and vitals reviewed.   ED Course  Procedures (including critical care time) Patient was evaluated and arrangements were made for him to see pain clinic and to obtain an orange card for financial assistance.  He'll be evaluated by psychiatry for his psychiatric tendencies.  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 Labs Review Labs Reviewed  COMPREHENSIVE METABOLIC PANEL - Abnormal; Notable for the following:    Glucose, Bld 143 (*)    Calcium 10.6 (*)    All other components within normal limits  ACETAMINOPHEN LEVEL - Abnormal; Notable for the  following:    Acetaminophen (Tylenol), Serum <10 (*)    All other components within normal limits  ETHANOL  SALICYLATE LEVEL  CBC  URINE RAPID DRUG SCREEN, HOSP PERFORMED  URINE RAPID DRUG SCREEN, HOSP PERFORMED    Imaging Review No results found. I have personally reviewed and evaluated these images and lab results as part of my medical decision-making.   EKG Interpretation None     Patient contracted for safety.  TTS felt patient was stable for  discharge.  IM medication given prior to discharge. MDM   Final diagnoses:  Chronic pain        Nelva Nay, MD 11/06/15 [phone]

## 2015-11-07 MED FILL — GABAPENTIN 300 MG CAPSULE: 300 | 5 days supply | Qty: 60 | Fill #0

## 2015-12-05 ENCOUNTER — Ambulatory Visit: Payer: Self-pay | Admitting: Family Medicine

## 2017-05-18 ENCOUNTER — Emergency Department (HOSPITAL_COMMUNITY): Payer: Medicaid Other

## 2017-05-18 ENCOUNTER — Inpatient Hospital Stay (HOSPITAL_COMMUNITY)
Admission: EM | Admit: 2017-05-18 | Discharge: 2017-06-13 | DRG: 917 | Disposition: E | Payer: Medicaid Other | Attending: Critical Care Medicine | Admitting: Critical Care Medicine

## 2017-05-18 DIAGNOSIS — G931 Anoxic brain damage, not elsewhere classified: Secondary | ICD-10-CM | POA: Diagnosis present

## 2017-05-18 DIAGNOSIS — R402312 Coma scale, best motor response, none, at arrival to emergency department: Secondary | ICD-10-CM | POA: Diagnosis present

## 2017-05-18 DIAGNOSIS — Z9911 Dependence on respirator [ventilator] status: Secondary | ICD-10-CM

## 2017-05-18 DIAGNOSIS — Z66 Do not resuscitate: Secondary | ICD-10-CM | POA: Diagnosis not present

## 2017-05-18 DIAGNOSIS — R402112 Coma scale, eyes open, never, at arrival to emergency department: Secondary | ICD-10-CM | POA: Diagnosis present

## 2017-05-18 DIAGNOSIS — N179 Acute kidney failure, unspecified: Secondary | ICD-10-CM | POA: Diagnosis present

## 2017-05-18 DIAGNOSIS — D573 Sickle-cell trait: Secondary | ICD-10-CM | POA: Diagnosis present

## 2017-05-18 DIAGNOSIS — E876 Hypokalemia: Secondary | ICD-10-CM | POA: Diagnosis not present

## 2017-05-18 DIAGNOSIS — E1101 Type 2 diabetes mellitus with hyperosmolarity with coma: Secondary | ICD-10-CM | POA: Diagnosis present

## 2017-05-18 DIAGNOSIS — K7200 Acute and subacute hepatic failure without coma: Secondary | ICD-10-CM | POA: Diagnosis present

## 2017-05-18 DIAGNOSIS — T50904A Poisoning by unspecified drugs, medicaments and biological substances, undetermined, initial encounter: Principal | ICD-10-CM | POA: Diagnosis present

## 2017-05-18 DIAGNOSIS — J9601 Acute respiratory failure with hypoxia: Secondary | ICD-10-CM | POA: Diagnosis present

## 2017-05-18 DIAGNOSIS — R402212 Coma scale, best verbal response, none, at arrival to emergency department: Secondary | ICD-10-CM | POA: Diagnosis present

## 2017-05-18 DIAGNOSIS — G8929 Other chronic pain: Secondary | ICD-10-CM | POA: Diagnosis present

## 2017-05-18 DIAGNOSIS — K922 Gastrointestinal hemorrhage, unspecified: Secondary | ICD-10-CM | POA: Diagnosis present

## 2017-05-18 DIAGNOSIS — E872 Acidosis: Secondary | ICD-10-CM | POA: Diagnosis present

## 2017-05-18 DIAGNOSIS — I469 Cardiac arrest, cause unspecified: Secondary | ICD-10-CM | POA: Diagnosis present

## 2017-05-18 DIAGNOSIS — Z590 Homelessness: Secondary | ICD-10-CM

## 2017-05-18 DIAGNOSIS — Y9289 Other specified places as the place of occurrence of the external cause: Secondary | ICD-10-CM

## 2017-05-18 DIAGNOSIS — I10 Essential (primary) hypertension: Secondary | ICD-10-CM | POA: Diagnosis present

## 2017-05-18 DIAGNOSIS — Z515 Encounter for palliative care: Secondary | ICD-10-CM | POA: Diagnosis not present

## 2017-05-18 DIAGNOSIS — I468 Cardiac arrest due to other underlying condition: Secondary | ICD-10-CM | POA: Diagnosis present

## 2017-05-18 DIAGNOSIS — J9602 Acute respiratory failure with hypercapnia: Secondary | ICD-10-CM | POA: Diagnosis present

## 2017-05-18 DIAGNOSIS — R579 Shock, unspecified: Secondary | ICD-10-CM | POA: Diagnosis present

## 2017-05-18 DIAGNOSIS — F329 Major depressive disorder, single episode, unspecified: Secondary | ICD-10-CM | POA: Diagnosis present

## 2017-05-18 DIAGNOSIS — G936 Cerebral edema: Secondary | ICD-10-CM | POA: Diagnosis present

## 2017-05-18 LAB — COMPREHENSIVE METABOLIC PANEL
ALK PHOS: 152 U/L — AB (ref 38–126)
ALT: 735 U/L — AB (ref 17–63)
AST: 931 U/L — AB (ref 15–41)
Albumin: 3.6 g/dL (ref 3.5–5.0)
Anion gap: 28 — ABNORMAL HIGH (ref 5–15)
BUN: 25 mg/dL — AB (ref 6–20)
CALCIUM: 11 mg/dL — AB (ref 8.9–10.3)
CHLORIDE: 99 mmol/L — AB (ref 101–111)
CO2: 8 mmol/L — AB (ref 22–32)
CREATININE: 3.78 mg/dL — AB (ref 0.61–1.24)
GFR calc Af Amer: 10 mL/min — ABNORMAL LOW (ref >60)
GFR, EST NON AFRICAN AMERICAN: 9 mL/min — AB (ref >60)
Glucose, Bld: 794 mg/dL (ref 65–99)
Potassium: 5.3 mmol/L — ABNORMAL HIGH (ref 3.5–5.1)
Sodium: 135 mmol/L (ref 135–145)
Total Bilirubin: 0.7 mg/dL (ref 0.3–1.2)
Total Protein: 6.3 g/dL — ABNORMAL LOW (ref 6.5–8.1)

## 2017-05-18 LAB — CBC WITH DIFFERENTIAL/PLATELET
BASOS PCT: 0 %
Basophils Absolute: 0 10*3/uL (ref 0.0–0.1)
EOS ABS: 0 10*3/uL (ref 0.0–0.7)
EOS PCT: 0 %
HCT: 44.2 % (ref 39.0–52.0)
HEMOGLOBIN: 14.2 g/dL (ref 13.0–17.0)
LYMPHS PCT: 21 %
Lymphs Abs: 3.1 10*3/uL (ref 0.7–4.0)
MCH: 32.7 pg (ref 26.0–34.0)
MCHC: 32.1 g/dL (ref 30.0–36.0)
MCV: 101.8 fL — AB (ref 78.0–100.0)
MONOS PCT: 6 %
Monocytes Absolute: 0.9 10*3/uL (ref 0.1–1.0)
NEUTROS ABS: 10.8 10*3/uL — AB (ref 1.7–7.7)
NEUTROS PCT: 73 %
PLATELETS: 314 10*3/uL (ref 150–400)
RBC: 4.34 MIL/uL (ref 4.22–5.81)
RDW: 13.1 % (ref 11.5–15.5)
WBC: 14.8 10*3/uL — ABNORMAL HIGH (ref 4.0–10.5)

## 2017-05-18 LAB — I-STAT CG4 LACTIC ACID, ED: Lactic Acid, Venous: 15.86 mmol/L (ref 0.5–1.9)

## 2017-05-18 LAB — I-STAT TROPONIN, ED: TROPONIN I, POC: 0.02 ng/mL (ref 0.00–0.08)

## 2017-05-18 MED ORDER — EPINEPHRINE PF 1 MG/ML IJ SOLN
0.5000 ug/min | INTRAVENOUS | Status: DC
  Administered 2017-05-18: 5 ug/min via INTRAVENOUS
  Administered 2017-05-19 (×3): 20 ug/min via INTRAVENOUS
  Administered 2017-05-19: 0.5 ug/min via INTRAVENOUS
  Administered 2017-05-19: 20 ug/min via INTRAVENOUS
  Filled 2017-05-18 (×5): qty 4

## 2017-05-18 MED ORDER — SODIUM CHLORIDE 0.9 % IV SOLN
INTRAVENOUS | Status: AC | PRN
  Administered 2017-05-18: 1000 mL via INTRAVENOUS

## 2017-05-18 MED ORDER — EPINEPHRINE PF 1 MG/10ML IJ SOSY
PREFILLED_SYRINGE | INTRAMUSCULAR | Status: AC | PRN
  Administered 2017-05-18: 1 via INTRAVENOUS

## 2017-05-18 MED ORDER — SODIUM CHLORIDE 0.9 % IV SOLN
0.0400 [IU]/min | INTRAVENOUS | Status: DC
  Administered 2017-05-19: 0.03 [IU]/min via INTRAVENOUS
  Filled 2017-05-18 (×2): qty 2

## 2017-05-18 MED ORDER — NOREPINEPHRINE BITARTRATE 1 MG/ML IV SOLN
0.0000 ug/min | INTRAVENOUS | Status: DC
  Administered 2017-05-19: 50 ug/min via INTRAVENOUS
  Filled 2017-05-18: qty 4

## 2017-05-18 MED ORDER — EPINEPHRINE PF 1 MG/10ML IJ SOSY
PREFILLED_SYRINGE | INTRAMUSCULAR | Status: AC | PRN
  Administered 2017-05-18: 0.5 mg via INTRAVENOUS
  Administered 2017-05-18: 1 via INTRAVENOUS

## 2017-05-18 NOTE — Code Documentation (Signed)
1L NS administered per verbal order Dr. Rush Landmarkegeler.

## 2017-05-18 NOTE — ED Notes (Signed)
Levophed initiated at 910mcg/min per verbal order Dr. Rush Landmarkegeler.

## 2017-05-18 NOTE — ED Provider Notes (Signed)
[facility]-EMERGENCY DEPT Provider Note   CSN: [id number] Arrival date & time: 01-Jun-2017  2247     History   Chief Complaint Chief Complaint  Patient presents with  . Cardiac Arrest    HPI Duane Bentley is a 46 y.o. male.  The history is provided by the EMS personnel.  Cardiac Arrest  Witnessed by:  Friend Incident location:  Unknown Time since incident:  40 minutes Time before ALS initiated:  1-2 minutes Condition upon EMS arrival:  Unresponsive Pulse:  Absent Initial cardiac rhythm per EMS:  Asystole Treatments prior to arrival:  ACLS protocol Medications given prior to ED:  Epinephrine (x6) Airway:  Bag valve mask Rhythm on admission to ED:  Normal sinus  LVL 5 caveat for cardiac arrest and unresponsiveness   No past medical history on file.  There are no active problems to display for this patient.   No past surgical history on file.     Home Medications    Prior to Admission medications   Not on File    Family History No family history on file.  Social History Social History  Substance Use Topics  . Smoking status: Not on file  . Smokeless tobacco: Not on file  . Alcohol use Not on file     Allergies   Patient has no allergy information on record.   Review of Systems Review of Systems  Unable to perform ROS: Patient unresponsive     Physical Exam Updated Vital Signs BP 96/78   Pulse (!) 53   Resp 14   Physical Exam  Constitutional: He appears well-developed. He appears distressed.  HENT:  Head: Normocephalic.  Eyes: Right pupil is not reactive. Left pupil is not reactive.  Pupils bilaterally fixed and dilated  Cardiovascular: Normal rate, regular rhythm and intact distal pulses.   No murmur heard. NSR on arrival, pt lost pulses and went into PEA shortly thereafter  Pulmonary/Chest: No stridor. He is in respiratory distress. He exhibits no tenderness.  Abdominal: There is no tenderness.  Musculoskeletal: He exhibits no  tenderness.  Neurological: He is unresponsive. A sensory deficit is present. He exhibits abnormal muscle tone. GCS eye subscore is 1. GCS verbal subscore is 1. GCS motor subscore is 1.  Skin: Skin is warm. Capillary refill takes 2 to 3 seconds.  Nursing note and vitals reviewed.    ED Treatments / Results  Labs (all labs ordered are listed, but only abnormal results are displayed) Labs Reviewed  CBC WITH DIFFERENTIAL/PLATELET - Abnormal; Notable for the following:       Result Value   WBC 14.8 (*)    MCV 101.8 (*)    Neutro Abs 10.8 (*)    All other components within normal limits  COMPREHENSIVE METABOLIC PANEL - Abnormal; Notable for the following:    Potassium 5.3 (*)    Chloride 99 (*)    CO2 8 (*)    Glucose, Bld 794 (*)    BUN 25 (*)    Creatinine, Ser 3.78 (*)    Calcium 11.0 (*)    Total Protein 6.3 (*)    AST 931 (*)    ALT 735 (*)    Alkaline Phosphatase 152 (*)    GFR calc non Af Amer 9 (*)    GFR calc Af Amer 10 (*)    Anion gap 28 (*)    All other components within normal limits  RAPID URINE DRUG SCREEN, HOSP PERFORMED - Abnormal; Notable for the following:  Cocaine POSITIVE (*)    All other components within normal limits  URINALYSIS, ROUTINE W REFLEX MICROSCOPIC - Abnormal; Notable for the following:    Glucose, UA >=500 (*)    Protein, ur 30 (*)    Squamous Epithelial / LPF 0-5 (*)    All other components within normal limits  BETA-HYDROXYBUTYRIC ACID - Abnormal; Notable for the following:    Beta-Hydroxybutyric Acid 0.34 (*)    All other components within normal limits  PROTIME-INR - Abnormal; Notable for the following:    Prothrombin Time 20.2 (*)    All other components within normal limits  FIBRINOGEN - Abnormal; Notable for the following:    Fibrinogen 131 (*)    All other components within normal limits  ETHANOL - Abnormal; Notable for the following:    Alcohol, Ethyl (B) 11 (*)    All other components within normal limits  I-STAT CG4 LACTIC  ACID, ED - Abnormal; Notable for the following:    Lactic Acid, Venous 15.86 (*)    All other components within normal limits  I-STAT ARTERIAL BLOOD GAS, ED - Abnormal; Notable for the following:    pH, Arterial 6.744 (*)    pCO2 arterial 68.9 (*)    pO2, Arterial 126.0 (*)    Bicarbonate 9.5 (*)    Acid-base deficit 27.0 (*)    All other components within normal limits  CBG MONITORING, ED - Abnormal; Notable for the following:    Glucose-Capillary >600 (*)    All other components within normal limits  CBG MONITORING, ED - Abnormal; Notable for the following:    Glucose-Capillary >600 (*)    All other components within normal limits  URINE CULTURE  CULTURE, BLOOD (ROUTINE X 2)  CULTURE, BLOOD (ROUTINE X 2)  CULTURE, RESPIRATORY (NON-EXPECTORATED)  MRSA PCR SCREENING  TROPONIN I  TROPONIN I  TROPONIN I  BASIC METABOLIC PANEL  BASIC METABOLIC PANEL  BASIC METABOLIC PANEL  CBC  MAGNESIUM  PHOSPHORUS  HEPATIC FUNCTION PANEL  BASIC METABOLIC PANEL  PROCALCITONIN  I-STAT TROPONIN, ED    EKG  EKG Interpretation None       Radiology Ct Head Wo Contrast  Result Date: 06/09/2017 CLINICAL DATA:  Cardiac arrest post CPR EXAM: CT HEAD WITHOUT CONTRAST CT CERVICAL SPINE WITHOUT CONTRAST TECHNIQUE: Multidetector CT imaging of the head and cervical spine was performed following the standard protocol without intravenous contrast. Multiplanar CT image reconstructions of the cervical spine were also generated. COMPARISON:  None. FINDINGS: CT HEAD FINDINGS Brain: Diffuse edema with loss of gray-white matter distinction consistent with anoxic brain injury. No hemorrhage is noted. Slit-like ventricles are noted likely from adjacent edema. Vascular: Relative hyperdense appearance of intracranial vessels likely due to hypodensity surrounding brain parenchyma. No intracranial hemorrhage. Skull: Negative Sinuses/Orbits: Ethmoid and maxillary sinus mucosal thickening. Intact orbits and globes.  Other: None CT CERVICAL SPINE FINDINGS Alignment: Intact craniocervical relationship and atlantodental interval. Reversal cervical lordosis from positioning. Skull base and vertebrae: Negative for fracture or bone destruction. Soft tissues and spinal canal: Prevertebral soft tissues are difficult to assess due to endotracheal and gastric tube placement. No intraspinal hemorrhage. Disc levels: No focal disc herniation, jumped or perched facets. No significant neural foraminal encroachment. Upper chest: Negative Other: None IMPRESSION: 1. Findings consistent with anoxic brain injury with diffuse loss of gray- white matter distinction. 2. No acute cervical spine abnormality. Electronically Signed   By: Tollie Eth M.D.   On: 06/09/17 03:11   Ct Cervical Spine Wo Contrast  Result Date: 05/28/2017 CLINICAL DATA:  Cardiac arrest post CPR EXAM: CT HEAD WITHOUT CONTRAST CT CERVICAL SPINE WITHOUT CONTRAST TECHNIQUE: Multidetector CT imaging of the head and cervical spine was performed following the standard protocol without intravenous contrast. Multiplanar CT image reconstructions of the cervical spine were also generated. COMPARISON:  None. FINDINGS: CT HEAD FINDINGS Brain: Diffuse edema with loss of gray-white matter distinction consistent with anoxic brain injury. No hemorrhage is noted. Slit-like ventricles are noted likely from adjacent edema. Vascular: Relative hyperdense appearance of intracranial vessels likely due to hypodensity surrounding brain parenchyma. No intracranial hemorrhage. Skull: Negative Sinuses/Orbits: Ethmoid and maxillary sinus mucosal thickening. Intact orbits and globes. Other: None CT CERVICAL SPINE FINDINGS Alignment: Intact craniocervical relationship and atlantodental interval. Reversal cervical lordosis from positioning. Skull base and vertebrae: Negative for fracture or bone destruction. Soft tissues and spinal canal: Prevertebral soft tissues are difficult to assess due to  endotracheal and gastric tube placement. No intraspinal hemorrhage. Disc levels: No focal disc herniation, jumped or perched facets. No significant neural foraminal encroachment. Upper chest: Negative Other: None IMPRESSION: 1. Findings consistent with anoxic brain injury with diffuse loss of gray- white matter distinction. 2. No acute cervical spine abnormality. Electronically Signed   By: Tollie Ethavid  Kwon M.D.   On: 05/26/2017 03:11   Dg Chest Portable 1 View  Result Date: 06/02/2017 CLINICAL DATA:  Status post cardiopulmonary resuscitation. EXAM: PORTABLE CHEST 1 VIEW COMPARISON:  None. FINDINGS: Endotracheal tube present with the tip approximately 3 cm above the carina. Pacing pad present. The heart is mildly enlarged. There is mediastinal widening which may be on the basis of low lung volumes. Lungs show some potential mild asymmetric opacity on the left compared to the right which may be due to atelectasis, pneumonia or aspiration. No pneumothorax or visible pleural fluid. IMPRESSION: Endotracheal tube shows appropriate positioning. Mild cardiomegaly, low lung volumes and potential asymmetric opacity of the left lung which may be secondary to atelectasis, pneumonia or aspiration. Mediastinal widening may be secondary to low lung volumes. No pneumothorax identified. Electronically Signed   By: Irish LackGlenn  Yamagata M.D.   On: 30-Nov-2016 23:16    Procedures Procedure Name: Intubation Date/Time: 05/23/2017 3:36 AM Performed by: Wynelle ClevelandMIZERA, KATHRYN Pre-anesthesia Checklist: Patient identified, Emergency Drugs available, Suction available and Patient being monitored Oxygen Delivery Method: Ambu bag Preoxygenation: Pre-oxygenation with 100% oxygen Laryngoscope Size: Glidescope Grade View: Grade I Tube size: 7.5 mm Number of attempts: 1 Placement Confirmation: ETT inserted through vocal cords under direct vision,  Positive ETCO2 and Breath sounds checked- equal and bilateral Dental Injury: Teeth and Oropharynx as  per pre-operative assessment       (including critical care time)  CRITICAL CARE Performed by: Canary Brimhristopher J Tegeler Total critical care time: 60 minutes Critical care time was exclusive of separately billable procedures and treating other patients. Critical care was necessary to treat or prevent imminent or life-threatening deterioration. Critical care was time spent personally by me on the following activities: development of treatment plan with patient and/or surrogate as well as nursing, discussions with consultants, evaluation of patient's response to treatment, examination of patient, obtaining history from patient or surrogate, ordering and performing treatments and interventions, ordering and review of laboratory studies, ordering and review of radiographic studies, pulse oximetry and re-evaluation of patient's condition.  Cardiopulmonary Resuscitation (CPR) Procedure Note Directed/Performed by: Canary Brimhristopher J Tegeler I personally directed ancillary staff and/or performed CPR in an effort to regain return of spontaneous circulation and to maintain cardiac, neuro and systemic perfusion.  Medications Ordered in ED Medications  EPINEPHrine (ADRENALIN) 1 MG/10ML injection (1 Syringe Intravenous Given 05/30/2017 2258)     Initial Impression / Assessment and Plan / ED Course  I have reviewed the triage vital signs and the nursing notes.  Pertinent labs & imaging results that were available during my care of the patient were reviewed by me and considered in my medical decision making (see chart for details).     Duane Bentley is a 46 y.o. male With an unknown past medical history who presents after cardiac arrest with unresponsiveness. Patient is brought in by EMS who report that patient had a witness arrest of unknown etiology. According to EMS report by fire department personnel, patient was found to be in asystole. Patient was given Narcan on the scene with no improvement.  Patient was then coded for proximally 35 minutes during transport. Patient regained ROSC just prior to arrival. According to EMS, patient received six dose of epinephrine during his resuscitation. Patient did not receive any shocks and was not in V. fib arrest.  On arrival, patient has a king airway in place. Patient has pulses.  On exam, patient's people are fixed and dilated bilaterally. Patients of breath sounds our course but equal bilaterally. Patient is unresponsive to any stimuli and has a GCS of three. Patient has abrasions on his arms but otherwise no evidence of head trauma.  While doing initial valuation, patient lost pulses. One round of the ACLS was performed with one dose of epinephrine. Patient had airway exchanged and was intubated by emergency team. No complications present during intubation. No sedation was used during intubation.   Patient then regained pulses. Patient started on norepinephrine drip. Initial EKG did not show evidence of STEMI. Critical-care team was called and they quickly assume care patient for further resuscitation.  Patient admitted for postarrest management by critical-care team.   Final Clinical Impressions(s) / ED Diagnoses   Final diagnoses:  Cardiac arrest (HCC)    Clinical Impression: 1. Cardiac arrest (HCC)   2. Ventilator dependent [facility])       Disposition: Admit to critical care    Tegeler, Canary Brim, MD 06/02/2017 1330

## 2017-05-18 NOTE — ED Triage Notes (Signed)
[place] a witnessed arrest [place]. CPR initiated at 2210. Given a total of 6 epis, 0.4 narcan, 1,07600mL NS. Asystole initially on the monitor. Faint pulses felt by EMS at 2242. Sinus tach at 100.

## 2017-05-18 NOTE — Code Documentation (Signed)
[place] Moved to [place] today after he decided he was too depressed to stay in [place]. Hx diabetes. No other info known at this time.

## 2017-05-19 ENCOUNTER — Inpatient Hospital Stay (HOSPITAL_COMMUNITY): Payer: Medicaid Other

## 2017-05-19 DIAGNOSIS — F329 Major depressive disorder, single episode, unspecified: Secondary | ICD-10-CM | POA: Diagnosis present

## 2017-05-19 DIAGNOSIS — K7200 Acute and subacute hepatic failure without coma: Secondary | ICD-10-CM | POA: Diagnosis present

## 2017-05-19 DIAGNOSIS — I469 Cardiac arrest, cause unspecified: Secondary | ICD-10-CM | POA: Diagnosis present

## 2017-05-19 DIAGNOSIS — I468 Cardiac arrest due to other underlying condition: Secondary | ICD-10-CM | POA: Diagnosis present

## 2017-05-19 DIAGNOSIS — R402312 Coma scale, best motor response, none, at arrival to emergency department: Secondary | ICD-10-CM | POA: Diagnosis present

## 2017-05-19 DIAGNOSIS — G936 Cerebral edema: Secondary | ICD-10-CM | POA: Diagnosis present

## 2017-05-19 DIAGNOSIS — E1101 Type 2 diabetes mellitus with hyperosmolarity with coma: Secondary | ICD-10-CM | POA: Diagnosis present

## 2017-05-19 DIAGNOSIS — G931 Anoxic brain damage, not elsewhere classified: Secondary | ICD-10-CM | POA: Diagnosis present

## 2017-05-19 DIAGNOSIS — R402212 Coma scale, best verbal response, none, at arrival to emergency department: Secondary | ICD-10-CM | POA: Diagnosis present

## 2017-05-19 DIAGNOSIS — T50904A Poisoning by unspecified drugs, medicaments and biological substances, undetermined, initial encounter: Secondary | ICD-10-CM | POA: Diagnosis present

## 2017-05-19 DIAGNOSIS — I10 Essential (primary) hypertension: Secondary | ICD-10-CM | POA: Diagnosis present

## 2017-05-19 DIAGNOSIS — D573 Sickle-cell trait: Secondary | ICD-10-CM | POA: Diagnosis present

## 2017-05-19 DIAGNOSIS — E872 Acidosis: Secondary | ICD-10-CM | POA: Diagnosis present

## 2017-05-19 DIAGNOSIS — N179 Acute kidney failure, unspecified: Secondary | ICD-10-CM | POA: Diagnosis present

## 2017-05-19 DIAGNOSIS — Z66 Do not resuscitate: Secondary | ICD-10-CM | POA: Diagnosis not present

## 2017-05-19 DIAGNOSIS — Z515 Encounter for palliative care: Secondary | ICD-10-CM | POA: Diagnosis not present

## 2017-05-19 DIAGNOSIS — E876 Hypokalemia: Secondary | ICD-10-CM | POA: Diagnosis not present

## 2017-05-19 DIAGNOSIS — Z590 Homelessness: Secondary | ICD-10-CM | POA: Diagnosis not present

## 2017-05-19 DIAGNOSIS — R579 Shock, unspecified: Secondary | ICD-10-CM | POA: Diagnosis present

## 2017-05-19 DIAGNOSIS — K922 Gastrointestinal hemorrhage, unspecified: Secondary | ICD-10-CM | POA: Diagnosis present

## 2017-05-19 DIAGNOSIS — G8929 Other chronic pain: Secondary | ICD-10-CM | POA: Diagnosis present

## 2017-05-19 DIAGNOSIS — Y9289 Other specified places as the place of occurrence of the external cause: Secondary | ICD-10-CM | POA: Diagnosis not present

## 2017-05-19 DIAGNOSIS — J9602 Acute respiratory failure with hypercapnia: Secondary | ICD-10-CM | POA: Diagnosis present

## 2017-05-19 DIAGNOSIS — J9601 Acute respiratory failure with hypoxia: Secondary | ICD-10-CM | POA: Diagnosis present

## 2017-05-19 DIAGNOSIS — R402112 Coma scale, eyes open, never, at arrival to emergency department: Secondary | ICD-10-CM | POA: Diagnosis present

## 2017-05-19 LAB — FIBRINOGEN: Fibrinogen: 131 mg/dL — ABNORMAL LOW (ref 210–475)

## 2017-05-19 LAB — GLUCOSE, CAPILLARY
Glucose-Capillary: >600 mg/dL (ref 65–99)
Glucose-Capillary: >600 mg/dL (ref 65–99)
Glucose-Capillary: 549 mg/dL (ref 65–99)
Glucose-Capillary: 491 mg/dL — ABNORMAL HIGH (ref 65–99)

## 2017-05-19 LAB — BLOOD GAS, ARTERIAL
ACID-BASE DEFICIT: 21.8 mmol/L — AB (ref 0.0–2.0)
BICARBONATE: 7.8 mmol/L — AB (ref 20.0–28.0)
FIO2: 40
LHR: 32 {breaths}/min
MECHVT: 620 mL
O2 Saturation: 86.6 %
PEEP/CPAP: 5 cmH2O
Patient temperature: 98.6
pCO2 arterial: 37.3 mmHg (ref 32.0–48.0)
pH, Arterial: 6.95 — CL (ref 7.350–7.450)
pO2, Arterial: 70.3 mmHg — ABNORMAL LOW (ref 83.0–108.0)

## 2017-05-19 LAB — POCT I-STAT 3, ART BLOOD GAS (G3+)
Acid-base deficit: 4 mmol/L — ABNORMAL HIGH (ref 0.0–2.0)
BICARBONATE: 25.4 mmol/L (ref 20.0–28.0)
O2 SAT: 77 %
PCO2 ART: 56.7 mmHg — AB (ref 32.0–48.0)
PO2 ART: 40 mmHg — AB (ref 83.0–108.0)
Patient temperature: 32.8
TCO2: 27 mmol/L (ref 0–100)
pH, Arterial: 7.236 — ABNORMAL LOW (ref 7.350–7.450)

## 2017-05-19 LAB — I-STAT ARTERIAL BLOOD GAS, ED
Acid-base deficit: 27 mmol/L — ABNORMAL HIGH (ref 0.0–2.0)
Bicarbonate: 9.5 mmol/L — ABNORMAL LOW (ref 20.0–28.0)
O2 SAT: 92 %
PCO2 ART: 68.9 mmHg — AB (ref 32.0–48.0)
PH ART: 6.744 — AB (ref 7.350–7.450)
TCO2: 12 mmol/L (ref 0–100)
pO2, Arterial: 126 mmHg — ABNORMAL HIGH (ref 83.0–108.0)

## 2017-05-19 LAB — BASIC METABOLIC PANEL
Anion gap: 27 — ABNORMAL HIGH (ref 5–15)
Anion gap: 24 — ABNORMAL HIGH (ref 5–15)
Anion gap: 25 — ABNORMAL HIGH (ref 5–15)
BUN: 28 mg/dL — ABNORMAL HIGH (ref 6–20)
BUN: 30 mg/dL — AB (ref 6–20)
BUN: 32 mg/dL — AB (ref 6–20)
CALCIUM: 7.9 mg/dL — AB (ref 8.9–10.3)
CALCIUM: 8.5 mg/dL — AB (ref 8.9–10.3)
CHLORIDE: 95 mmol/L — AB (ref 101–111)
CHLORIDE: 100 mmol/L — AB (ref 101–111)
CO2: 8 mmol/L — ABNORMAL LOW (ref 22–32)
CO2: 8 mmol/L — ABNORMAL LOW (ref 22–32)
CO2: 13 mmol/L — AB (ref 22–32)
CREATININE: 4.03 mg/dL — AB (ref 0.61–1.24)
CREATININE: 4.27 mg/dL — AB (ref 0.61–1.24)
Calcium: 8.4 mg/dL — ABNORMAL LOW (ref 8.9–10.3)
Chloride: 101 mmol/L (ref 101–111)
Creatinine, Ser: 4.02 mg/dL — ABNORMAL HIGH (ref 0.61–1.24)
GFR calc non Af Amer: 16 mL/min — ABNORMAL LOW (ref >60)
GFR calc non Af Amer: 15 mL/min — ABNORMAL LOW (ref >60)
GFR, EST AFRICAN AMERICAN: 19 mL/min — AB (ref >60)
GFR, EST AFRICAN AMERICAN: 19 mL/min — AB (ref >60)
GFR, EST AFRICAN AMERICAN: 17 mL/min — AB (ref >60)
GFR, EST NON AFRICAN AMERICAN: 16 mL/min — AB (ref >60)
GLUCOSE: 743 mg/dL — AB (ref 65–99)
Glucose, Bld: 914 mg/dL (ref 65–99)
Glucose, Bld: 829 mg/dL (ref 65–99)
POTASSIUM: 6.4 mmol/L — AB (ref 3.5–5.1)
Potassium: 3.8 mmol/L (ref 3.5–5.1)
Potassium: 2.5 mmol/L — CL (ref 3.5–5.1)
SODIUM: 130 mmol/L — AB (ref 135–145)
Sodium: 133 mmol/L — ABNORMAL LOW (ref 135–145)
Sodium: 138 mmol/L (ref 135–145)

## 2017-05-19 LAB — POCT I-STAT, CHEM 8
BUN: 40 mg/dL — ABNORMAL HIGH (ref 6–20)
CHLORIDE: 102 mmol/L (ref 101–111)
CREATININE: 3.4 mg/dL — AB (ref 0.61–1.24)
Calcium, Ion: 1.09 mmol/L — ABNORMAL LOW (ref 1.15–1.40)
GLUCOSE: 693 mg/dL — AB (ref 65–99)
HCT: 35 % — ABNORMAL LOW (ref 39.0–52.0)
Hemoglobin: 11.9 g/dL — ABNORMAL LOW (ref 13.0–17.0)
POTASSIUM: 2.7 mmol/L — AB (ref 3.5–5.1)
Sodium: 145 mmol/L (ref 135–145)
TCO2: 26 mmol/L (ref 0–100)

## 2017-05-19 LAB — COMPREHENSIVE METABOLIC PANEL
ALBUMIN: 2.4 g/dL — AB (ref 3.5–5.0)
ALT: 1654 U/L — ABNORMAL HIGH (ref 17–63)
AST: 2370 U/L — AB (ref 15–41)
Alkaline Phosphatase: 594 U/L — ABNORMAL HIGH (ref 38–126)
Anion gap: 24 — ABNORMAL HIGH (ref 5–15)
BILIRUBIN TOTAL: 1.5 mg/dL — AB (ref 0.3–1.2)
BUN: 33 mg/dL — AB (ref 6–20)
CHLORIDE: 101 mmol/L (ref 101–111)
CO2: 14 mmol/L — ABNORMAL LOW (ref 22–32)
Calcium: 8.4 mg/dL — ABNORMAL LOW (ref 8.9–10.3)
Creatinine, Ser: 4.32 mg/dL — ABNORMAL HIGH (ref 0.61–1.24)
GFR calc Af Amer: 17 mL/min — ABNORMAL LOW (ref >60)
GFR calc non Af Amer: 15 mL/min — ABNORMAL LOW (ref >60)
GLUCOSE: 749 mg/dL — AB (ref 65–99)
POTASSIUM: 2.5 mmol/L — AB (ref 3.5–5.1)
Sodium: 139 mmol/L (ref 135–145)
TOTAL PROTEIN: 4.4 g/dL — AB (ref 6.5–8.1)

## 2017-05-19 LAB — URINALYSIS, ROUTINE W REFLEX MICROSCOPIC
BACTERIA UA: NONE SEEN
BILIRUBIN URINE: NEGATIVE
Glucose, UA: >=500 mg/dL — AB
Hgb urine dipstick: NEGATIVE
Ketones, ur: NEGATIVE mg/dL
Leukocytes, UA: NEGATIVE
Nitrite: NEGATIVE
Protein, ur: 30 mg/dL — AB
SPECIFIC GRAVITY, URINE: 1.028 (ref 1.005–1.030)
pH: 5 (ref 5.0–8.0)

## 2017-05-19 LAB — BETA-HYDROXYBUTYRIC ACID: BETA-HYDROXYBUTYRIC ACID: 0.34 mmol/L — AB (ref 0.05–0.27)

## 2017-05-19 LAB — ETHANOL: Alcohol, Ethyl (B): 11 mg/dL — ABNORMAL HIGH (ref <5)

## 2017-05-19 LAB — CBC
HCT: 41.6 % (ref 39.0–52.0)
Hemoglobin: 13.4 g/dL (ref 13.0–17.0)
MCH: 31.6 pg (ref 26.0–34.0)
MCHC: 32.2 g/dL (ref 30.0–36.0)
MCV: 98.1 fL (ref 78.0–100.0)
PLATELETS: 255 10*3/uL (ref 150–400)
RBC: 4.24 MIL/uL (ref 4.22–5.81)
RDW: 13 % (ref 11.5–15.5)
WBC: 26.4 10*3/uL — ABNORMAL HIGH (ref 4.0–10.5)

## 2017-05-19 LAB — PROTIME-INR
INR: 1.71
Prothrombin Time: 20.2 seconds — ABNORMAL HIGH (ref 11.4–15.2)

## 2017-05-19 LAB — HEPATIC FUNCTION PANEL
ALK PHOS: 564 U/L — AB (ref 38–126)
ALT: 1703 U/L — ABNORMAL HIGH (ref 17–63)
AST: 2176 U/L — AB (ref 15–41)
Albumin: 2.7 g/dL — ABNORMAL LOW (ref 3.5–5.0)
Bilirubin, Direct: 0.6 mg/dL — ABNORMAL HIGH (ref 0.1–0.5)
Indirect Bilirubin: 0.7 mg/dL (ref 0.3–0.9)
TOTAL PROTEIN: 5.2 g/dL — AB (ref 6.5–8.1)
Total Bilirubin: 1.3 mg/dL — ABNORMAL HIGH (ref 0.3–1.2)

## 2017-05-19 LAB — RAPID URINE DRUG SCREEN, HOSP PERFORMED
AMPHETAMINES: NOT DETECTED
BENZODIAZEPINES: NOT DETECTED
Barbiturates: NOT DETECTED
Cocaine: POSITIVE — AB
OPIATES: NOT DETECTED
TETRAHYDROCANNABINOL: NOT DETECTED

## 2017-05-19 LAB — PHOSPHORUS: Phosphorus: 10.1 mg/dL — ABNORMAL HIGH (ref 2.5–4.6)

## 2017-05-19 LAB — CBG MONITORING, ED

## 2017-05-19 LAB — LACTIC ACID, PLASMA
LACTIC ACID, VENOUS: 13.8 mmol/L — AB (ref 0.5–1.9)
Lactic Acid, Venous: 12.7 mmol/L (ref 0.5–1.9)

## 2017-05-19 LAB — TROPONIN I: Troponin I: 0.91 ng/mL (ref <0.03)

## 2017-05-19 LAB — MRSA PCR SCREENING: MRSA by PCR: POSITIVE — AB

## 2017-05-19 LAB — MAGNESIUM: MAGNESIUM: 2.8 mg/dL — AB (ref 1.7–2.4)

## 2017-05-19 LAB — PROCALCITONIN: PROCALCITONIN: 4.33 ng/mL

## 2017-05-19 LAB — CORTISOL: Cortisol, Plasma: 0.8 ug/dL

## 2017-05-19 MED ORDER — PIPERACILLIN-TAZOBACTAM 3.375 G IVPB
3.3750 g | Freq: Three times a day (TID) | INTRAVENOUS | Status: DC
  Administered 2017-05-19: 3.375 g via INTRAVENOUS
  Filled 2017-05-19: qty 50

## 2017-05-19 MED ORDER — CALCIUM GLUCONATE 10 % IV SOLN: 1.0000 g | Freq: Once | INTRAVENOUS | Status: DC

## 2017-05-19 MED ORDER — SODIUM BICARBONATE 8.4 % IV SOLN: 25.0000 meq | Freq: Once | INTRAVENOUS | Status: DC

## 2017-05-19 MED ORDER — VANCOMYCIN HCL 10 G IV SOLR
2000.0000 mg | Freq: Once | INTRAVENOUS | Status: AC
  Administered 2017-05-19: 2000 mg via INTRAVENOUS
  Filled 2017-05-19: qty 2000

## 2017-05-19 MED ORDER — MORPHINE BOLUS VIA INFUSION
5.0000 mg | INTRAVENOUS | Status: DC | PRN
Start: 2017-05-19 — End: 2017-05-19
  Filled 2017-05-19: qty 20

## 2017-05-19 MED ORDER — SODIUM CHLORIDE 0.9 % IV SOLN: 250.0000 mL | INTRAVENOUS | Status: DC | PRN

## 2017-05-19 MED ORDER — NOREPINEPHRINE BITARTRATE 1 MG/ML IV SOLN
0.0000 ug/min | INTRAVENOUS | Status: DC
  Administered 2017-05-19: 50 ug/min via INTRAVENOUS
  Filled 2017-05-19 (×4): qty 16

## 2017-05-19 MED ORDER — POTASSIUM CHLORIDE 20 MEQ/15ML (10%) PO SOLN
40.0000 meq | Freq: Once | ORAL | Status: AC
  Administered 2017-05-19: 40 meq
  Filled 2017-05-19: qty 30

## 2017-05-19 MED ORDER — SODIUM CHLORIDE 0.9 % IV SOLN
INTRAVENOUS | Status: DC
  Administered 2017-05-19: 27.1 [IU]/h via INTRAVENOUS
  Administered 2017-05-19: 5.4 [IU]/h via INTRAVENOUS
  Filled 2017-05-19 (×3): qty 1

## 2017-05-19 MED ORDER — PANTOPRAZOLE SODIUM 40 MG IV SOLR
40.0000 mg | Freq: Two times a day (BID) | INTRAVENOUS | Status: DC
Start: 2017-05-19 — End: 2017-05-19
  Administered 2017-05-19: 40 mg via INTRAVENOUS
  Filled 2017-05-19: qty 40

## 2017-05-19 MED ORDER — SODIUM BICARBONATE 8.4 % IV SOLN
50.0000 meq | Freq: Once | INTRAVENOUS | Status: AC
  Administered 2017-05-19: 50 meq via INTRAVENOUS

## 2017-05-19 MED ORDER — MORPHINE SULFATE 50 MG/ML IV SOLN
10.0000 mg/h | INTRAVENOUS | Status: DC
  Administered 2017-05-19: 10 mg/h via INTRAVENOUS
  Filled 2017-05-19: qty 10

## 2017-05-19 MED ORDER — SODIUM BICARBONATE 8.4 % IV SOLN
INTRAVENOUS | Status: AC
  Administered 2017-05-19: 50 meq
  Filled 2017-05-19: qty 150

## 2017-05-19 MED ORDER — SODIUM CHLORIDE 0.9 % IV SOLN
1.0000 g | Freq: Once | INTRAVENOUS | Status: DC
  Filled 2017-05-19: qty 10

## 2017-05-19 MED ORDER — SODIUM BICARBONATE 8.4 % IV SOLN
INTRAVENOUS | Status: AC
Start: 2017-05-19 — End: 2017-05-19
  Filled 2017-05-19: qty 50

## 2017-05-19 MED ORDER — SODIUM CHLORIDE 0.9 % IV BOLUS (SEPSIS)
1000.0000 mL | Freq: Once | INTRAVENOUS | Status: AC
  Administered 2017-05-19: 1000 mL via INTRAVENOUS

## 2017-05-19 MED ORDER — HEPARIN SODIUM (PORCINE) 5000 UNIT/ML IJ SOLN: 5000.0000 [IU] | Freq: Three times a day (TID) | INTRAMUSCULAR | Status: DC

## 2017-05-19 MED ORDER — SODIUM CHLORIDE 0.9 % IV SOLN
2.0000 g | Freq: Once | INTRAVENOUS | Status: AC
  Administered 2017-05-19: 2 g via INTRAVENOUS
  Filled 2017-05-19: qty 20

## 2017-05-19 MED ORDER — PANTOPRAZOLE SODIUM 40 MG IV SOLR: 40.0000 mg | Freq: Every day | INTRAVENOUS | Status: DC

## 2017-05-19 MED ORDER — STERILE WATER FOR INJECTION IV SOLN
INTRAVENOUS | Status: DC
  Administered 2017-05-19 (×2): via INTRAVENOUS
  Filled 2017-05-19 (×5): qty 850

## 2017-05-19 MED FILL — Medication: Qty: 1 | Status: AC

## 2017-05-20 LAB — URINE CULTURE: CULTURE: NO GROWTH

## 2017-05-24 LAB — CULTURE, BLOOD (ROUTINE X 2)
CULTURE: NO GROWTH
Culture: NO GROWTH
Special Requests: ADEQUATE

## 2017-06-13 NOTE — ED Notes (Signed)
No sedation given, no purposeful movement at this time. CCM at bedside

## 2017-06-13 NOTE — Procedures (Signed)
Arterial Catheter Insertion Procedure Note Rudy JewSmoaks Uuu Doe [id number] 10/13/1877  Procedure: Insertion of Arterial Catheter  Indications: Blood pressure monitoring  Procedure Details Consent: Unable to obtain consent because of emergent medical necessity. Time Out: Verified patient identification, verified procedure, site/side was marked, verified correct patient position, special equipment/implants available, medications/allergies/relevent history reviewed, required imaging and test results available.  Performed  Maximum sterile technique was used including antiseptics, cap, gloves, gown, hand hygiene, mask and sheet. Skin prep: Chlorhexidine; local anesthetic administered 20 gauge catheter was inserted into left radial artery using the Seldinger technique.  Evaluation Blood flow good; BP tracing good. Complications: No apparent complications.   Hiram ComberJoseph R Nettie Cromwell 06/05/2017

## 2017-06-13 NOTE — Progress Notes (Signed)
PULMONARY / CRITICAL CARE MEDICINE   Name: Duane Bentley MRN: [id number] DOB: 10/15/1970    ADMISSION DATE:  05/20/2017 CONSULTATION DATE:  05/26/2017   REFERRING MD:  Dr. Rush Landmarkegeler   CHIEF COMPLAINT:  Cardiac Arrest   SUBJECTIVE:  No family located yet.  SBP remains in 30's despite max dose levophed, epi, vaso.  Also on HCO3 gtt. CT head seen and reviewed > anoxic injury with loss of gray - white matter. Last ABG at 0430 6.95/37/70.   VITAL SIGNS: BP (!) 58/27   Pulse (!) 55   Temp (!) 91 F (32.8 C)   Resp (!) 32   Ht 6' (1.829 m)   Wt 120.4 kg (265 lb 6.9 oz)   SpO2 (!) 89%   BMI 36.00 kg/m   HEMODYNAMICS:    VENTILATOR SETTINGS: Vent Mode: PRVC FiO2 (%):  [40 %-100 %] 80 % Set Rate:  [22 bmp] 22 bmp Vt Set:  [620 mL] 620 mL PEEP:  [5 cmH20] 5 cmH20 Plateau Pressure:  [22 cmH20-23 cmH20] 22 cmH20  INTAKE / OUTPUT: I/O last 3 completed shifts: In: 5634.5 [P.O.:50; I.V.:5414.5; IV Piggyback:170] Out: 1230 [Urine:5; Emesis/NG output:1225]  PHYSICAL EXAMINATION: General:  Adult male, critically ill Neuro:  Pupils fixed and dilated, no cough/gag, no corneal, does not withdraw, no spontaneous respirations HEENT:  ETT in place  Cardiovascular:  RRR, no M/R/G Lungs:  Clear breath sounds, no wheeze/crackles  Abdomen:  Distended, active bowel sounds  Musculoskeletal:  No deformities, no edema Skin:  Cool, dry, intact   LABS:  BMET  Recent Labs Lab 15-Jan-2017 2310 05/13/2017 0300 05/24/2017 0515  NA 135 130* 133*  K 5.3* 6.4* 3.8  CL 99* 95* 101  CO2 8* 8* 8*  BUN 25* 28* 30*  CREATININE 3.78* 4.02* 4.03*  GLUCOSE 794* 914* 829*    Electrolytes  Recent Labs Lab 15-Jan-2017 2310 06/04/2017 0300 06/10/2017 0515  CALCIUM 11.0* 8.4* 7.9*  MG  --   --  2.8*  PHOS  --   --  10.1*    CBC  Recent Labs Lab 15-Jan-2017 2310 06/12/2017 0515  WBC 14.8* 26.4*  HGB 14.2 13.4  HCT 44.2 41.6  PLT 314 255    Coag's  Recent Labs Lab 06/05/2017 0141  INR 1.71     Sepsis Markers  Recent Labs Lab 15-Jan-2017 2320 05/28/2017 0300 05/20/2017 0518  LATICACIDVEN 15.86*  --  13.8*  PROCALCITON  --  4.33  --     ABG  Recent Labs Lab 06/07/2017 0006 06/10/2017 0450  PHART 6.744* 6.950*  PCO2ART 68.9* 37.3  PO2ART 126.0* 70.3*    Liver Enzymes  Recent Labs Lab 15-Jan-2017 2310 05/29/2017 0515  AST 931* 2,176*  ALT 735* 1,703*  ALKPHOS 152* 564*  BILITOT 0.7 1.3*  ALBUMIN 3.6 2.7*    Cardiac Enzymes  Recent Labs Lab 05/17/2017 0517  TROPONINI 0.91*    Glucose  Recent Labs Lab 06/11/2017 0038 06/02/2017 0202 06/01/2017 0339 05/29/2017 0448 06/11/2017 0544 06/08/2017 0650  GLUCAP >600* >600* >600* >600* >600* >600*    Imaging Ct Head Wo Contrast  Result Date: 05/24/2017 CLINICAL DATA:  Cardiac arrest post CPR EXAM: CT HEAD WITHOUT CONTRAST CT CERVICAL SPINE WITHOUT CONTRAST TECHNIQUE: Multidetector CT imaging of the head and cervical spine was performed following the standard protocol without intravenous contrast. Multiplanar CT image reconstructions of the cervical spine were also generated. COMPARISON:  None. FINDINGS: CT HEAD FINDINGS Brain: Diffuse edema with loss of gray-white matter distinction consistent with anoxic  brain injury. No hemorrhage is noted. Slit-like ventricles are noted likely from adjacent edema. Vascular: Relative hyperdense appearance of intracranial vessels likely due to hypodensity surrounding brain parenchyma. No intracranial hemorrhage. Skull: Negative Sinuses/Orbits: Ethmoid and maxillary sinus mucosal thickening. Intact orbits and globes. Other: None CT CERVICAL SPINE FINDINGS Alignment: Intact craniocervical relationship and atlantodental interval. Reversal cervical lordosis from positioning. Skull base and vertebrae: Negative for fracture or bone destruction. Soft tissues and spinal canal: Prevertebral soft tissues are difficult to assess due to endotracheal and gastric tube placement. No intraspinal hemorrhage. Disc  levels: No focal disc herniation, jumped or perched facets. No significant neural foraminal encroachment. Upper chest: Negative Other: None IMPRESSION: 1. Findings consistent with anoxic brain injury with diffuse loss of gray- white matter distinction. 2. No acute cervical spine abnormality. Electronically Signed   By: Tollie Eth M.D.   On: 06/01/2017 03:11   Ct Cervical Spine Wo Contrast  Result Date: 05/28/2017 CLINICAL DATA:  Cardiac arrest post CPR EXAM: CT HEAD WITHOUT CONTRAST CT CERVICAL SPINE WITHOUT CONTRAST TECHNIQUE: Multidetector CT imaging of the head and cervical spine was performed following the standard protocol without intravenous contrast. Multiplanar CT image reconstructions of the cervical spine were also generated. COMPARISON:  None. FINDINGS: CT HEAD FINDINGS Brain: Diffuse edema with loss of gray-white matter distinction consistent with anoxic brain injury. No hemorrhage is noted. Slit-like ventricles are noted likely from adjacent edema. Vascular: Relative hyperdense appearance of intracranial vessels likely due to hypodensity surrounding brain parenchyma. No intracranial hemorrhage. Skull: Negative Sinuses/Orbits: Ethmoid and maxillary sinus mucosal thickening. Intact orbits and globes. Other: None CT CERVICAL SPINE FINDINGS Alignment: Intact craniocervical relationship and atlantodental interval. Reversal cervical lordosis from positioning. Skull base and vertebrae: Negative for fracture or bone destruction. Soft tissues and spinal canal: Prevertebral soft tissues are difficult to assess due to endotracheal and gastric tube placement. No intraspinal hemorrhage. Disc levels: No focal disc herniation, jumped or perched facets. No significant neural foraminal encroachment. Upper chest: Negative Other: None IMPRESSION: 1. Findings consistent with anoxic brain injury with diffuse loss of gray- white matter distinction. 2. No acute cervical spine abnormality. Electronically Signed   By: Tollie Eth M.D.   On: 05/27/2017 03:11   Dg Chest Portable 1 View  Result Date: May 31, 2017 CLINICAL DATA:  Status post cardiopulmonary resuscitation. EXAM: PORTABLE CHEST 1 VIEW COMPARISON:  None. FINDINGS: Endotracheal tube present with the tip approximately 3 cm above the carina. Pacing pad present. The heart is mildly enlarged. There is mediastinal widening which may be on the basis of low lung volumes. Lungs show some potential mild asymmetric opacity on the left compared to the right which may be due to atelectasis, pneumonia or aspiration. No pneumothorax or visible pleural fluid. IMPRESSION: Endotracheal tube shows appropriate positioning. Mild cardiomegaly, low lung volumes and potential asymmetric opacity of the left lung which may be secondary to atelectasis, pneumonia or aspiration. Mediastinal widening may be secondary to low lung volumes. No pneumothorax identified. Electronically Signed   By: Irish Lack M.D.   On: 2017/05/31 23:16     STUDIES:  CXR 8/6 > Endotracheal tube shows appropriate positioning. Mild cardiomegaly, low lung volumes and potential asymmetric opacity of the left lung which may be secondary to atelectasis, pneumonia or aspiration. Mediastinal widening may be secondary to low lung volumes. No pneumothorax identified CT Head 8/7 > anoxic brain injury with loss of gray white distinction  CULTURES: Blood 8/6 >  U/A 8/6 >  Sputum 8/7 >  ANTIBIOTICS: Vancomycin 8/7 > 8/7 Zosyn 8/7 > 8/7  SIGNIFICANT EVENTS: 8/7 > Presents to ED.  Later made DNR given clinical condition - no benefit of CPR / ACLS in his current state.  LINES/TUBES: ETT 8/6 >> Left Radial Aline 8/7 >> Left Femoral CVC 8/7 >>   DISCUSSION: 46 year old male with witnessed arrest [place], reported asystole for 35 minutes before return of ROSC, upon arrival to hospital continued to code PEA for additional 3 times for a total of 15 minutes.  Update 0830 AM on 08/07:  Refractory shock  and acidosis despite 3 pressors and HCO3 gtt.  Head CT with severe anoxic injury.  ASSESSMENT / PLAN:  Cardiac Arrest > Asystole Arrest 35 minutes OSH, 3 Additional Arrest in ED for 15 minutes total  Multi-System Organ Failure  H/O HTN  Continue levophed / epi / vaso. Code status changed to DNR, see below.  ?OverDose - sent text to a friend saying "you wont ever have to worry about seeing me again" Anoxic Encephalopathy  Chronic Pain secondary to ? Sickle Cell H/O SI, Depression  P:   RASS goal: 0/-1.   Acute Kidney Failure  Metabolic Acidosis with Anion Gap  Lactic Acidosis  Hypokalemia - 2.3 on iSTAT Continue HCO3 gtt @ 150 ml/hr. 40 mEq K per tube now. DNR status given no medical benefit with CPR / ACLS as pt will almost certainly not survive.  Acute Hypoxic/Hypercarbic Respiratory Failure  No weaning due to mental status Unlikely to survive.  R/o occult infection Continue vanc / zosyn. Follow cultures.  HONK H/O DM    Continue insulin gtt   FAMILY  - Updates: No family available.  Per employer, he is estranged and has been working for him in [place].  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  On 08/06, he was supposed to go to [place]; however, came to [place] instead (though he has no home here and was homeless here just 3 - 4 years ago). Given his refractory shock and acidosis, physical exam findings along with head CT showing severe anoxia, Duane Bentley will almost certainly not survive.  - Inter-disciplinary family meet or Palliative Care meeting due by:  05/26/2017   Additional CC Time: 45 min.   Rutherford Guys, [place] - C  Pulmonary & Critical Care Medicine Pager: [phone]  or [phone] 2017/06/15, 8:38 AM

## 2017-06-13 NOTE — Procedures (Signed)
Central Venous Catheter Insertion Procedure Note Everitt AmberSmoaks Uuu Doe [id number] 10/13/1877  Procedure: Insertion of Central Venous Catheter Indications: Assessment of intravascular volume, Drug and/or fluid administration and Frequent blood sampling  Procedure Details Consent: Unable to obtain consent because of emergent medical necessity. Time Out: Verified patient identification, verified procedure, site/side was marked, verified correct patient position, special equipment/implants available, medications/allergies/relevent history reviewed, required imaging and test results available.  Performed  Maximum sterile technique was used including antiseptics, cap, gloves, gown, hand hygiene, mask and sheet. Skin prep: Chlorhexidine; local anesthetic administered A antimicrobial bonded/coated triple lumen catheter was placed in the left femoral vein due to emergent situation using the Seldinger technique.  Evaluation Blood flow good Complications: No apparent complications Patient did tolerate procedure well. Chest X-ray ordered to verify placement.  CXR: pending.  Jovita KussmaulKatalina Lady Wisham, AGACNP-BC [facility]  Pgr: [phone]  PCCM Pgr: [phone]

## 2017-06-13 NOTE — Progress Notes (Signed)
See my note from 8/7: I documented severe anoxic brain injury.   Progress Note   Date: 06/08/2017  Patient Name: Duane Bentley        MRN#: [id number]   The diagnostic studies of the brain supports a diagnosis of:   Cerebral edema    Max Fickle, MD

## 2017-06-13 NOTE — Procedures (Signed)
Intubation Procedure Note Duane Bentley [id number] 10/13/1877  Procedure: Intubation Indications: Airway protection and maintenance  Procedure Details Consent: Unable to obtain consent because of emergent medical necessity. and   Time Out: Verified patient identification, verified procedure, site/side was marked, verified correct patient position, special equipment/implants available, medications/allergies/relevent history reviewed, required imaging and test results available.  Performed  Maximum sterile technique was used including antiseptics, gloves and hand hygiene.  MAC and 4    Evaluation Hemodynamic Status: BP stable throughout; O2 sats: stable throughout Patient's Current Condition: stable Complications: No apparent complications Patient did tolerate procedure well. Chest X-ray ordered to verify placement.  CXR: tube position acceptable.   Duane Bentley 06/11/2017

## 2017-06-13 NOTE — Progress Notes (Signed)
CRITICAL VALUE ALERT  Critical Value:  K 2.5  Glucose 749  Date & Time Notied:  0924 05/29/2017  Provider Notified: Celine Mansesai, PA   Orders Received/Actions taken: Continue insulin gtt and replace K. See mar for orders. Will continue to monitor closely.  Delories HeinzMelissa Donovan Gatchel, RN

## 2017-06-13 NOTE — Clinical Social Work Note (Signed)
CSW verbally consulted to assist with contacting next of kin for pt. CSW met with pt's friend, Al Mayo [phone]) at bedside. Pt intubated and unresponsive. CSW staffed case with [place].   Per RN note, Mr. Brett Albino was able to locate a contact number for pt's brother, mother and sister-in-law. Per CSW AD Zack, RN or MD to call family to notify of pt's death. CSW made CSW Director aware of situation in the event pt becomes an unclaimed body.   Oretha Ellis, [place], [place] Work [phone]

## 2017-06-13 NOTE — Progress Notes (Signed)
[facility] PCCM ICU Attending  I have seen and examined Duane Bentley this morning.  He left a message with his neighbor yesterday indicating that they would never see him again. His best friend is in the room this morning and indicates that he has never had a suicide attempt in the past but he has had periods of depression.  This morning he has refractory shock despite multiple vasopressors.  He is not oxygenating well.  Vitals:   05/16/2017 0807 06/08/2017 0826 05/25/2017 0834 05/14/2017 0838  BP:      Pulse: (!) 55 (!) 57 (!) 55 (!) 54  Resp: (!) 32 (!) 32 (!) 32 (!) 32  Temp: (!) 91 F (32.8 C) (!) 91 F (32.8 C) (!) 91 F (32.8 C) (!) 91 F (32.8 C)  TempSrc:      SpO2: 90% (!) 88% 90% 91%  Weight:      Height:       Current blood pressure 32/25 on maximum dose epinephrine, norepinephrine  Vent Mode: PRVC FiO2 (%):  [40 %-100 %] 100 % Set Rate:  [22 bmp-32 bmp] 32 bmp Vt Set:  [620 mL-650 mL] 650 mL PEEP:  [5 cmH20] 5 cmH20 Plateau Pressure:  [22 cmH20-23 cmH20] 23 cmH20  General:  In bed on vent HENT: NCAT ETT in place PULM: CTA B, vent supported breathing CV: RRR, no mgr GI: No bowel sounds, soft, nontender MSK: normal bulk and tone Neuro: no vent GCS 3, no response to external stimuli (pain, etc).  Pupils fixed, non-responsive to light, no gag or cough reflex. No doll's eyes reflex  CT head: grey-white matter border lost, diffuse edema  Impresion: Severe anoxic brain injury Cardiac arrest Refractory shock Severe acidosis  Discussion: Duane Bentley unfortunately is at a point where no amount of medicine will help him.  He will die from this illness.  He has refractory shock and acidosis despite full medical support.  He has irreversible severe anoxic brain injury.  Further resuscitation efforts will not be helpful.    Plan: Full DNR Continue current drips until brother arrives If we cannot get in contact with brother we will need to stop efforts because what we are doing now is  of no medical benefit and is only prolonging suffering.  My cc time 40 minutes  Heber CarolinaBrent Chrisie Jankovich, MD [place] [facility] Pager: [phone] Cell: [phone] After 3pm or if no response, call [phone]

## 2017-06-13 NOTE — Death Summary Note (Signed)
DEATH SUMMARY   Patient Details  Name: Duane Bentley MRN: [id number] DOB: [birth year]  Admission/Discharge Information   Admit Date:  05/24/17  Date of Death: Date of Death: 2017/05/25  Time of Death: Time of Death: 1438  Length of Stay: 1  Referring Physician: Patient, No Pcp Per   Reason(s) for Hospitalization  Drug overdose, cardiac arrest  Diagnoses  Preliminary cause of death:  Secondary Diagnoses (including complications and co-morbidities):  Active Problems:   Cardiac arrest [facility]) Drug overdose Acute kidney injury Refractory shock  Brief Hospital Course (including significant findings, care, treatment, and services provided and events leading to death)  Duane Bentley is a 46 y.o. year old male who Has a past medical history significant for depression and sickle trait who apparently contacted his neighbor the day prior to admission with a suicide note. He stated you won't ever have to worry about seeing me again". He was then found several hours later unresponsive and brought to the emergency department. He underwent a long period of CPR prior to arrival. In the ER he required endotracheal tube intubation and full mechanical ventilatory support with multiple IV vasopressors to maintain his blood pressure. He was admitted to the intensive care unit but despite administration of broad-spectrum antibiotics, IV bicarbonate, and multiple vasopressors his blood pressure continued to drop. We made repeated efforts to contact his family but could not reach them. He did have a best friend which was with him at the bedside who is very helpful. He died peacefully with his friend at the bedside.    Pertinent Labs and Studies  Significant Diagnostic Studies Ct Head Wo Contrast  Result Date: 05/25/17 CLINICAL DATA:  Cardiac arrest post CPR EXAM: CT HEAD WITHOUT CONTRAST CT CERVICAL SPINE WITHOUT CONTRAST TECHNIQUE: Multidetector CT imaging of the head and cervical spine was performed  following the standard protocol without intravenous contrast. Multiplanar CT image reconstructions of the cervical spine were also generated. COMPARISON:  None. FINDINGS: CT HEAD FINDINGS Brain: Diffuse edema with loss of gray-white matter distinction consistent with anoxic brain injury. No hemorrhage is noted. Slit-like ventricles are noted likely from adjacent edema. Vascular: Relative hyperdense appearance of intracranial vessels likely due to hypodensity surrounding brain parenchyma. No intracranial hemorrhage. Skull: Negative Sinuses/Orbits: Ethmoid and maxillary sinus mucosal thickening. Intact orbits and globes. Other: None CT CERVICAL SPINE FINDINGS Alignment: Intact craniocervical relationship and atlantodental interval. Reversal cervical lordosis from positioning. Skull base and vertebrae: Negative for fracture or bone destruction. Soft tissues and spinal canal: Prevertebral soft tissues are difficult to assess due to endotracheal and gastric tube placement. No intraspinal hemorrhage. Disc levels: No focal disc herniation, jumped or perched facets. No significant neural foraminal encroachment. Upper chest: Negative Other: None IMPRESSION: 1. Findings consistent with anoxic brain injury with diffuse loss of gray- white matter distinction. 2. No acute cervical spine abnormality. Electronically Signed   By: Tollie Eth M.D.   On: 2017-05-25 03:11   Ct Cervical Spine Wo Contrast  Result Date: 2017/05/25 CLINICAL DATA:  Cardiac arrest post CPR EXAM: CT HEAD WITHOUT CONTRAST CT CERVICAL SPINE WITHOUT CONTRAST TECHNIQUE: Multidetector CT imaging of the head and cervical spine was performed following the standard protocol without intravenous contrast. Multiplanar CT image reconstructions of the cervical spine were also generated. COMPARISON:  None. FINDINGS: CT HEAD FINDINGS Brain: Diffuse edema with loss of gray-white matter distinction consistent with anoxic brain injury. No hemorrhage is noted. Slit-like  ventricles are noted likely from adjacent edema. Vascular: Relative hyperdense appearance of  intracranial vessels likely due to hypodensity surrounding brain parenchyma. No intracranial hemorrhage. Skull: Negative Sinuses/Orbits: Ethmoid and maxillary sinus mucosal thickening. Intact orbits and globes. Other: None CT CERVICAL SPINE FINDINGS Alignment: Intact craniocervical relationship and atlantodental interval. Reversal cervical lordosis from positioning. Skull base and vertebrae: Negative for fracture or bone destruction. Soft tissues and spinal canal: Prevertebral soft tissues are difficult to assess due to endotracheal and gastric tube placement. No intraspinal hemorrhage. Disc levels: No focal disc herniation, jumped or perched facets. No significant neural foraminal encroachment. Upper chest: Negative Other: None IMPRESSION: 1. Findings consistent with anoxic brain injury with diffuse loss of gray- white matter distinction. 2. No acute cervical spine abnormality. Electronically Signed   By: Tollie Ethavid  Kwon M.D.   On: 2017-05-17 03:11   Dg Chest Portable 1 View  Result Date: 05/27/2017 CLINICAL DATA:  Status post cardiopulmonary resuscitation. EXAM: PORTABLE CHEST 1 VIEW COMPARISON:  None. FINDINGS: Endotracheal tube present with the tip approximately 3 cm above the carina. Pacing pad present. The heart is mildly enlarged. There is mediastinal widening which may be on the basis of low lung volumes. Lungs show some potential mild asymmetric opacity on the left compared to the right which may be due to atelectasis, pneumonia or aspiration. No pneumothorax or visible pleural fluid. IMPRESSION: Endotracheal tube shows appropriate positioning. Mild cardiomegaly, low lung volumes and potential asymmetric opacity of the left lung which may be secondary to atelectasis, pneumonia or aspiration. Mediastinal widening may be secondary to low lung volumes. No pneumothorax identified. Electronically Signed   By: Irish LackGlenn   Yamagata M.D.   On: 06/03/2017 23:16    Microbiology Recent Results (from the past 240 hour(s))  Urine culture     Status: None   Collection Time: 03/05/17 12:06 AM  Result Value Ref Range Status   Specimen Description URINE, RANDOM  Final   Special Requests NONE  Final   Culture NO GROWTH  Final   Report Status 05/20/2017 FINAL  Final  Culture, blood (routine x 2)     Status: None (Preliminary result)   Collection Time: 03/05/17  1:20 AM  Result Value Ref Range Status   Specimen Description BLOOD RIGHT ANTECUBITAL  Final   Special Requests   Final    BOTTLES DRAWN AEROBIC AND ANAEROBIC Blood Culture adequate volume   Culture NO GROWTH 1 DAY  Final   Report Status PENDING  Incomplete  Culture, blood (routine x 2)     Status: None (Preliminary result)   Collection Time: 03/05/17  1:25 AM  Result Value Ref Range Status   Specimen Description BLOOD RIGHT WRIST  Final   Special Requests   Final    IN PEDIATRIC BOTTLE Blood Culture results may not be optimal due to an excessive volume of blood received in culture bottles   Culture NO GROWTH 1 DAY  Final   Report Status PENDING  Incomplete  MRSA PCR Screening     Status: Abnormal   Collection Time: 03/05/17  3:55 AM  Result Value Ref Range Status   MRSA by PCR POSITIVE (A) NEGATIVE Final    Comment:        The GeneXpert MRSA Assay (FDA approved for NASAL specimens only), is one component of a comprehensive MRSA colonization surveillance program. It is not intended to diagnose MRSA infection nor to guide or monitor treatment for MRSA infections. RESULT CALLED TO, READ BACK BY AND VERIFIED WITH: Charma IgoM GARSTKA 06/03/2017 @ 0749 M VESTAL     Lab Basic Metabolic Panel:  Recent Labs Lab 06-13-17 2310 05/18/2017 0300 06/06/2017 0515 05/25/2017 0817 05/21/2017 0825  NA 135 130* 133* 139  138 145  K 5.3* 6.4* 3.8 2.5*  2.5* 2.7*  CL 99* 95* 101 101  100* 102  CO2 8* 8* 8* 14*  13*  --   GLUCOSE 794* 914* 829* 749*  743* 693*  BUN  25* 28* 30* 33*  32* 40*  CREATININE 3.78* 4.02* 4.03* 4.32*  4.27* 3.40*  CALCIUM 11.0* 8.4* 7.9* 8.4*  8.5*  --   MG  --   --  2.8*  --   --   PHOS  --   --  10.1*  --   --    Liver Function Tests:  Recent Labs Lab 06/13/2017 2310 06/09/2017 0515 06/03/2017 0817  AST 931* 2,176* 2,370*  ALT 735* 1,703* 1,654*  ALKPHOS 152* 564* 594*  BILITOT 0.7 1.3* 1.5*  PROT 6.3* 5.2* 4.4*  ALBUMIN 3.6 2.7* 2.4*   No results for input(s): LIPASE, AMYLASE in the last 168 hours. No results for input(s): AMMONIA in the last 168 hours. CBC:  Recent Labs Lab 06-13-17 2310 05/30/2017 0515 06/04/2017 0825  WBC 14.8* 26.4*  --   NEUTROABS 10.8*  --   --   HGB 14.2 13.4 11.9*  HCT 44.2 41.6 35.0*  MCV 101.8* 98.1  --   PLT 314 255  --    Cardiac Enzymes:  Recent Labs Lab 05/18/2017 0517  TROPONINI 0.91*   Sepsis Labs:  Recent Labs Lab 06/13/17 2310 13-Jun-2017 2320 05/13/2017 0300 05/23/2017 0515 06/04/2017 0518 05/27/2017 0714  PROCALCITON  --   --  4.33  --   --   --   WBC 14.8*  --   --  26.4*  --   --   LATICACIDVEN  --  15.86*  --   --  13.8* 12.7*    Procedures/Operations  As above   Max Fickle 05/21/2017, 2:54 PM

## 2017-06-13 NOTE — Progress Notes (Signed)
Woman reporting to be patients sister Ayesha Rumpf(Renee Potocki) & mother Thurnell Garbe(Charlene Debose) called department reporting they had been given number from patient friend Al.  They shared Al informed them of passing of patient and had details of the report we were given on admission to the hospital.  I offered support and confirmed the information that Al had provided them.    I referred them to Patient placement to work through process with morgue and valuables that were sent to security.    Renee thanked me for the help and taking time to talk to them.  No other needs expressed at this time.    Jacqulyn Canehristopher Lucion Lenox Ladouceur RN, BSN, CCRN

## 2017-06-13 NOTE — Progress Notes (Signed)
2 amps bicarb given per verbal order OssipeeDesi, PA

## 2017-06-13 NOTE — ED Notes (Signed)
Ice removed, per Dr. Jeraldine LootsHammond pt will not be cooled.

## 2017-06-13 NOTE — ED Notes (Signed)
Attempted to place c-collar aspen, unable to due to body habitus.

## 2017-06-13 NOTE — Progress Notes (Signed)
This RN attempting to find Laqueta Lindenhris Fifita, patients brother per patients best friend Al. Stat consult to social work for help with finding any of patient's family members. Per [employer] Mayo, (patient's best friend) pt has been estranged from his family for several years. Constant effort to find patients family members. Social work at the bedside. Will continue to monitor patient and diligently try and find family member.   Delories HeinzMelissa Toria Monte, RN

## 2017-06-13 NOTE — Progress Notes (Signed)
Patient transported from ED to CT and then to [facility] with no complications.

## 2017-06-13 NOTE — Progress Notes (Signed)
Pharmacy Antibiotic Note  Duane RhodesScott D Kingma is a 46 y.o. male admitted on 05/24/2017 s/p cardiac arrest, possible sepsis.  Pharmacy has been consulted for Vancomycin and Zosyn  dosing.  Plan: Zosyn 3.375 g IV q8h  Vancomycin 2 g IV now.  F/U renal function for further doses   Temp (24hrs), Avg:94.5 F (34.7 C), Min:94.5 F (34.7 C), Max:94.5 F (34.7 C)   Recent Labs Lab 05/30/2017 2310 06/09/2017 2320  WBC 14.8*  --   CREATININE 3.78*  --   LATICACIDVEN  --  15.86*    CrCl cannot be calculated (Unknown ideal weight.).    Allergies not on file   Eddie Candlebbott, Cori Justus Vernon 05/17/2017 1:22 AM

## 2017-06-13 NOTE — Progress Notes (Signed)
Responded to page to support patient friend of five years at bedside. Patient friend wanted council on what would happen to patient body if no family can be found after patient passes. Nurse and Chaplain shared next steps and gave him contact numbers and how to proceeded. Per nurse and patient friend there is a brother who no one can seem to reach. Patient friend said patient was estranged from family for years but he would continue to try and locate family and he does he will have them call patient placement and unit 2 Heart.. Provided prayer, empathetic listening,emotional, spiritual and grief support.Patient was extubated and now deceased.  Remained at bedside with patient and friend and escorted to friend to parking lot.    05/29/2017 1505  Clinical Encounter Type  Visited With Patient and family together;Health care provider  Visit Type Initial;Psychological support;Spiritual support;Patient actively dying  Referral From Nurse  Spiritual Encounters  Spiritual Needs Prayer;Emotional;Grief support  Stress Factors  Family Stress Factors Loss  Fae PippinWatlington, Antawn Sison, Chaplain, [facility], Pager [phone]

## 2017-06-13 NOTE — Progress Notes (Signed)
[facility] PCCM Worsening shock, BP now 25/21 on maximum vasopressor support.  Have made multiple attempts to contact family, all unsuccessful.  Plan withdrawal of care. Heber CarolinaBrent Lovina Zuver, MD [facility] Pager: [phone] Cell: [phone] After 3pm or if no response, call [phone]

## 2017-06-13 NOTE — Progress Notes (Addendum)
Al Mayo [phone] (Patient's best friend)

## 2017-06-13 NOTE — Progress Notes (Addendum)
Time of death 1438. Verified with Ellwood HandlerLisa Yow, RN.  Delories HeinzMelissa Alakai Macbride, RN

## 2017-06-13 NOTE — H&P (Signed)
PULMONARY / CRITICAL CARE MEDICINE   Name: Duane Bentley MRN: [id number] DOB: 10/13/1877    ADMISSION DATE:  06/04/2017 CONSULTATION DATE:  05/25/2017   REFERRING MD:  Dr. Rush Landmarkegeler   CHIEF COMPLAINT:  Cardiac Arrest   HISTORY OF PRESENT ILLNESS:   46 year old male with PMH of HTN, DM, Depression (Mutiple admissions for SI), and Sickle Cell  Presents to ED on 8/6 as a witnessed cardiac arrest. Upon arrival EMS reported that patient was asystole with CPR for 35 minutes before return of ROSC. Patient arrest an additional 3 times in ED for a total of 15 minutes. ABG 6.7/68/126, LA 15.86, AST/ALT 931/735. PCCM asked to admit.   Patient had cell phone and medications on him. Called Last number who stated the patient was Duane Bentley and that he was working with his friend in [place] when today he decided to move back to [place] (and be homeless) and not work because he was to depressed, his last text message sent out stated "you never have to worry about seeing me again".    PAST MEDICAL HISTORY :  He  has no past medical history on file.  PAST SURGICAL HISTORY: He  has no past surgical history on file.  Allergies not on file  No current facility-administered medications on file prior to encounter.    No current outpatient prescriptions on file prior to encounter.    FAMILY HISTORY:  His has no family status information on file.    SOCIAL HISTORY: He    REVIEW OF SYSTEMS:   Unable to review   SUBJECTIVE:    VITAL SIGNS: BP (!) 76/49   Pulse 66   Temp (!) 94.5 F (34.7 C) (Rectal)   Resp (!) 30   SpO2 100%   HEMODYNAMICS:    VENTILATOR SETTINGS: Vent Mode: PRVC FiO2 (%):  [100 %] 100 % Set Rate:  [22 bmp] 22 bmp Vt Set:  [782[620 mL] 620 mL PEEP:  [5 cmH20] 5 cmH20 Plateau Pressure:  [23 cmH20] 23 cmH20  INTAKE / OUTPUT: No intake/output data recorded.  PHYSICAL EXAMINATION: General:  Adult male, no distress  Neuro:  Pupils fixed and dilated, no cough/gag, no  corneal, does not withdrawal  HEENT:  ETT in place  Cardiovascular:  Huston FoleyBrady, no MRG  Lungs:  Clear breath sounds, no wheeze/crackles  Abdomen:  Distended, active bowel sounds  Musculoskeletal:  -edema  Skin:  Cool, dry, intact   LABS:  BMET  Recent Labs Lab 2017/09/04 2310  NA 135  K 5.3*  CL 99*  CO2 8*  BUN 25*  CREATININE 3.78*  GLUCOSE 794*    Electrolytes  Recent Labs Lab 2017/09/04 2310  CALCIUM 11.0*    CBC  Recent Labs Lab 2017/09/04 2310  WBC 14.8*  HGB 14.2  HCT 44.2  PLT 314    Coag's No results for input(s): APTT, INR in the last 168 hours.  Sepsis Markers  Recent Labs Lab 2017/09/04 2320  LATICACIDVEN 15.86*    ABG  Recent Labs Lab 06/12/2017 0006  PHART 6.744*  PCO2ART 68.9*  PO2ART 126.0*    Liver Enzymes  Recent Labs Lab 2017/09/04 2310  AST 931*  ALT 735*  ALKPHOS 152*  BILITOT 0.7  ALBUMIN 3.6    Cardiac Enzymes No results for input(s): TROPONINI, PROBNP in the last 168 hours.  Glucose  Recent Labs Lab 06/04/2017 0038  GLUCAP >600*    Imaging Dg Chest Portable 1 View  Result Date: 05/14/2017 CLINICAL DATA:  Status  post cardiopulmonary resuscitation. EXAM: PORTABLE CHEST 1 VIEW COMPARISON:  None. FINDINGS: Endotracheal tube present with the tip approximately 3 cm above the carina. Pacing pad present. The heart is mildly enlarged. There is mediastinal widening which may be on the basis of low lung volumes. Lungs show some potential mild asymmetric opacity on the left compared to the right which may be due to atelectasis, pneumonia or aspiration. No pneumothorax or visible pleural fluid. IMPRESSION: Endotracheal tube shows appropriate positioning. Mild cardiomegaly, low lung volumes and potential asymmetric opacity of the left lung which may be secondary to atelectasis, pneumonia or aspiration. Mediastinal widening may be secondary to low lung volumes. No pneumothorax identified. Electronically Signed   By: Irish Lack M.D.    On: 06/10/2017 23:16     STUDIES:  CXR 8/6 > Endotracheal tube shows appropriate positioning. Mild cardiomegaly, low lung volumes and potential asymmetric opacity of the left lung which may be secondary to atelectasis, pneumonia or aspiration. Mediastinal widening may be secondary to low lung volumes. No pneumothorax identified CT Head 8/7 >> CT C Spine 8/7 >>   CULTURES: Blood 8/6 >> U/A 8/6 >>  Sputum 8/7 >>   ANTIBIOTICS: Vancomycin 8/7 >> Zosyn 8/7 >>  SIGNIFICANT EVENTS: 8/7 > Presents to ED   LINES/TUBES: ETT 8/6 >> Left Radial Aline 8/7 >> Left Femoral CVC 8/7 >>   DISCUSSION: 46 year old male with witnessed arrest [place], reported asystole for 35 minutes before return of ROSC, upon arrival to hospital continued to code PEA for additional 3 times for a total of 15 minutes.   ASSESSMENT / PLAN:  PULMONARY A: Acute Hypoxic/Hypercarbic Respiratory Failure  P:   Vent Support  No wean due to mental status  Trend CXR/ABG   CARDIOVASCULAR A:  Cardiac Arrest > Asystole Arrest 35 minutes OSH, 3 Additional Arrest in ED for 15 minutes total  Multi-System Organ Failure  H/O HTN  P:  Cardiac Monitoring  No cool due to instability as well as prolonged down-time  ECHO Pending  Maintain MAP >65  Currently maxed out on EPI/Levophed/Vaso Gtt   RENAL A:   Acute Kidney Failure  Metabolic Acidosis with Anion Gap  Lactic Acidosis  P:   Trend BMP Replace electrolytes as needed  Bicarb @ 150 ml/hr   GASTROINTESTINAL A:   Liver Shock  ?GI Bleed  -Increased Bloody OG output  P:   NPO PPI BID > Increased  Trend LFT   HEMATOLOGIC A:   ? Sickle Cell > Per EPIC  P:  Trend CBC  SCDS INR/Fibrinogen pending   INFECTIOUS A:   Leukocytosis  P:   Trend WBC and Fever Curve Follow culture data  Vancomycin and Zosyn  Trend Lactic Acid and PCT   ENDOCRINE A: Hyperglycemia  H/O DM    P:   Trend Glucose  Beta-Hydro Pending Insulin gtt    NEUROLOGIC A:   ?OverDose  Metabolic vs Anoxic Encephalopathy  Chronic Pain secondary to Sickle Cell?  H/O SI, Depression  P:   RASS goal: 0/-1 Monitor  UDS pending  ETOH pending  EEG pending   FAMILY  - Updates: No family   - Inter-disciplinary family meet or Palliative Care meeting due by:  05/26/2017   CC Time: 72 minutes   Jovita Kussmaul, AGACNP-BC [facility]  Pgr: [phone]  PCCM Pgr: [phone]  Attending Addendum: I personally examined this patient and agree with plan as detailed above. Briefly this is a a 46yoM with hx  of DM, HTN, Chronic pain with chronic opiate use, and prior history of SI, now admitted with prolonged out of hospital cardiac arrest, severe anoxic encephalopathy, AKI, Severe metabolic acidosis due to lactic acidosis (?dead gut), Shock liver, Shock (septic vs cardiogenic), UGIB, and Nonketotic hyperglycemia. On exam his pupils are fixed and dilated. He has no cough or gag and is not breathing over the vent. I question if he may already be brain dead. Head and Cspine CT pending. Will not pursue therapeutic hypothermia as it is contraindicated given his UGIB and also feel he would not tolerate the associated electrolyte shifts given his hemodynamic instability. Continue 3 vasopressors for his shock. Has received 3L NS bolus and continuing on bicarb gtt now. Will pan-culture and start empiric broad spectrum antibiotics. Trend lactate and procal. Start insulin gtt. His prognosis with the multiorgan system failure is unfortunately grim. Have made several attempts to contact family tonight but no answer on any numbers we called.   60 minutes critical care time  Milana Obey, MD Pulmonary & Critical Care

## 2017-06-13 NOTE — Progress Notes (Signed)
Al Mayo, just called as he was able to locate patients mother, Sister in LucasLaw, and the brother Laqueta LindenChris Omlor. Mr. Nancy MarusMayo states," Blossom HoopsChris Loge's phone number is [phone]". Will try an locate family.  Delories HeinzMelissa Faun Mcqueen, RN

## 2017-06-13 NOTE — Progress Notes (Signed)
Spoke with Asencion PartridgeKeith Harris, ME. This will be a ME case. Followed up with patient placement and updated flow sheet.  Thank you  everyone for the care of this patient.  Delories HeinzMelissa Breaker Springer, RN

## 2017-06-13 NOTE — Progress Notes (Signed)
Patient terminally extubated to room air per MD order. RN, family, and chaplain at bedside. RT will continue to monitor.

## 2017-06-13 NOTE — Progress Notes (Signed)
Chaplain stopped in to meet the friend who is at bedside of this patient.  Friend talks about the patient having difficult relationships with his family.  Friend feels that he, the patient, would want him here.  Chaplain will continue to provide support as needed.  Please page Chaplain should additional support be needed.    07-26-2017 1107  Clinical Encounter Type  Visited With Family (Best friend of patient)  Visit Type Initial;Psychological support;Spiritual support;Social support;Critical Care

## 2017-06-13 NOTE — ED Notes (Signed)
1 amp of bicarb given by NP.

## 2017-06-13 NOTE — Care Management Note (Signed)
Case Management Note Donn PieriniKristi Ellarose Brandi RN, BSN Unit 4E-Case Manager-- 2H coverage [phone]  Patient Details  Name: Duane Bentley MRN: [id number] Date of Birth: 08/06/1971  Subjective/Objective:   Pt admitted s/p  Witnessed cardiac arrest  -- per MD note prognosis grim- pt remains on vent               Action/Plan: PTA pt was independent- attempting to reach brother- anticipate hospital death  Expected Discharge Date:                  Expected Discharge Plan:    In-House Referral:     Discharge planning Services  CM Consult  Post Acute Care Choice:    Choice offered to:     DME Arranged:    DME Agency:     HH Arranged:    HH Agency:     Status of Service:  In process, will continue to follow  If discussed at Long Length of Stay Meetings, dates discussed:    Discharge Disposition:   Additional Comments:  Darrold SpanWebster, Duane Swords Hall, RN 05/14/2017, 10:12 AM

## 2017-06-13 DEATH — deceased

## 2018-07-20 IMAGING — CT CT CERVICAL SPINE W/O CM
5 of 8 series · 13 of 33 positions shown, 14 images · non-contrast
Comparison: None.

CLINICAL DATA: Cardiac arrest post CPR

EXAM:
CT HEAD WITHOUT CONTRAST
CT CERVICAL SPINE WITHOUT CONTRAST
TECHNIQUE: Multidetector CT imaging of the head and cervical spine was
performed following the standard protocol without intravenous
contrast. Multiplanar CT image reconstructions of the cervical spine
were also generated.

[Series 5: head 2.0 h70h · axial · 0.45mm/px · z∈[-65,-13]mm · 2 of 80 slices shown]
[im 27/80  bone]
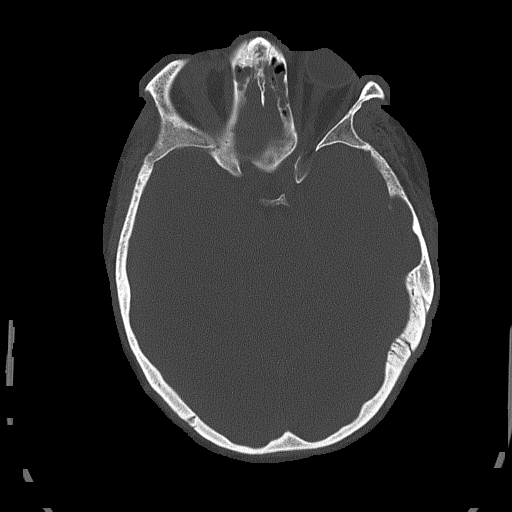
[im 53/80  bone]
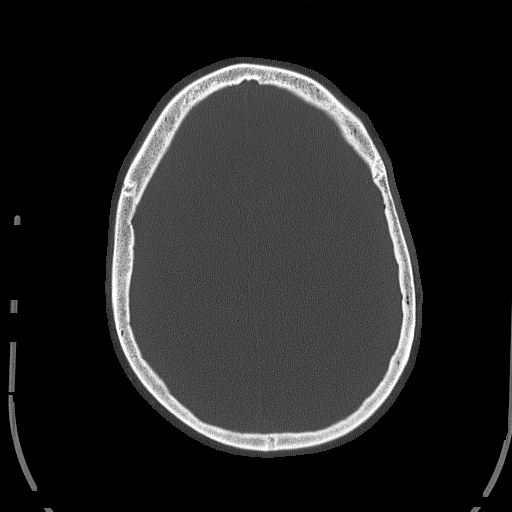

[Series 9: c_spine 2.0 st · axial · 0.30mm/px · z∈[-238,-142]mm · 3 of 98 slices shown, 4 images]
[im 25/98  soft-tissue]
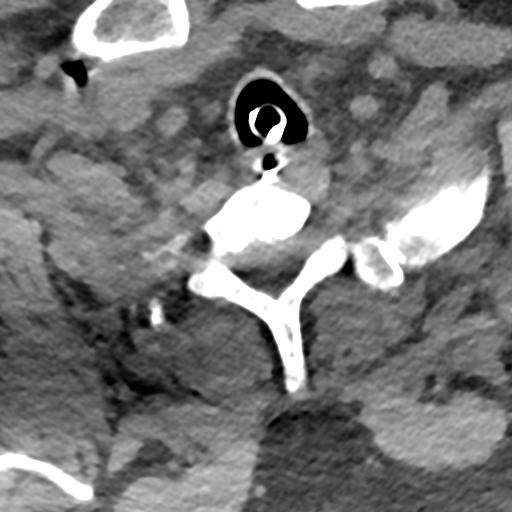
[im 25/98  bone]
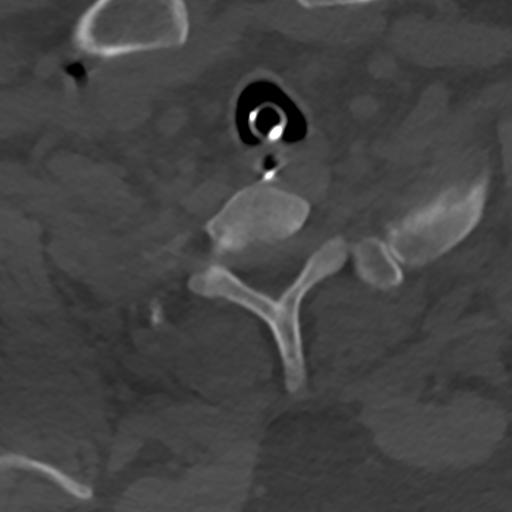
[im 49/98  bone]
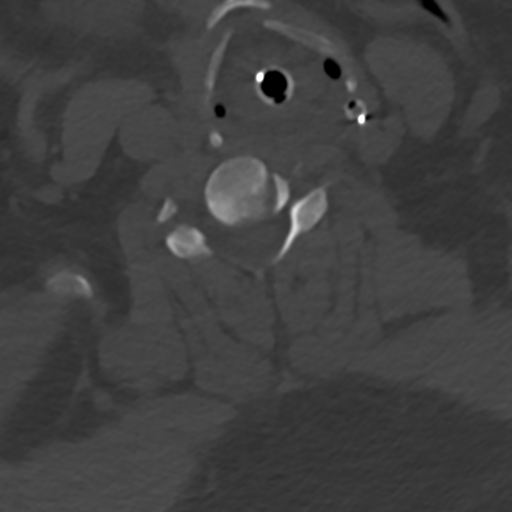
[im 73/98  bone]
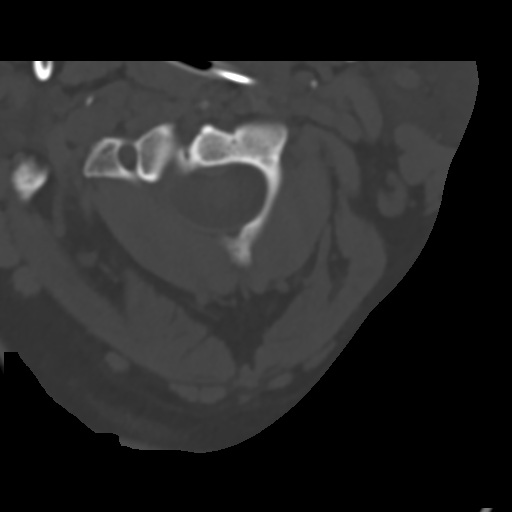

[Series 12: coronal bone · coronal · 0.28mm/px · 1 of 55 slices shown]
[im 28/55  bone]
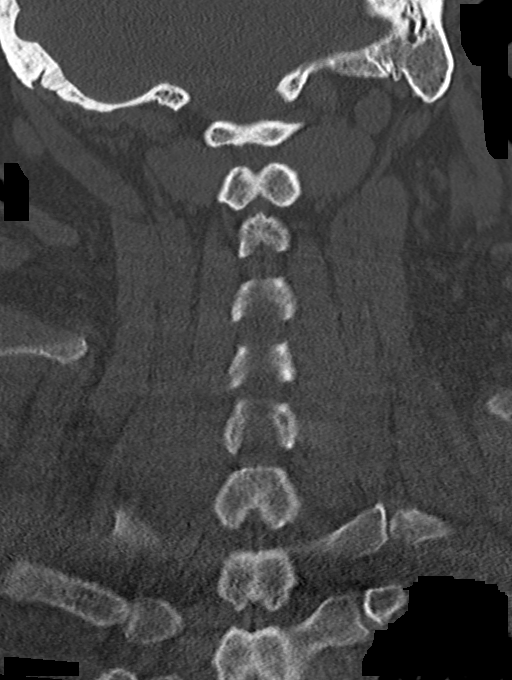

[Series 13: sagittal bone · sagittal · 0.25mm/px · 5 of 61 slices shown]
[im 11/61  bone]
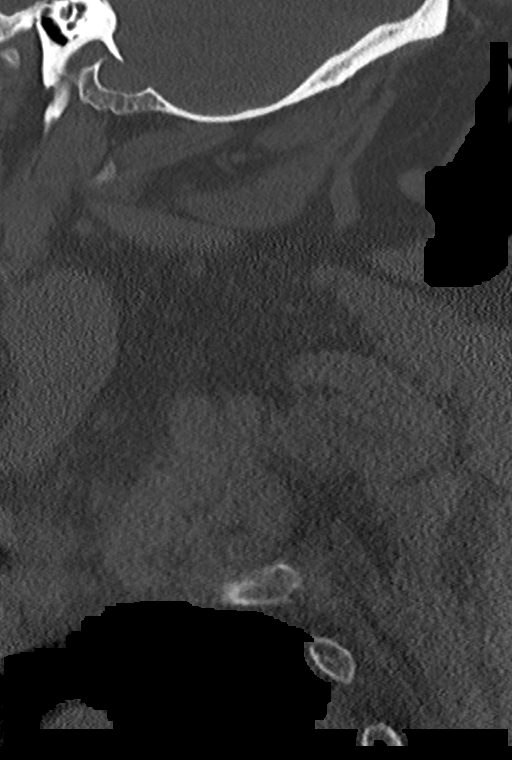
[im 21/61  bone]
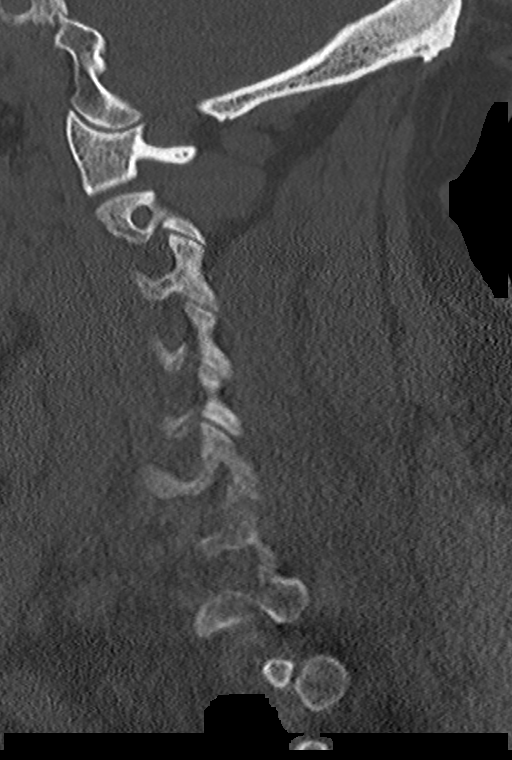
[im 31/61  bone]
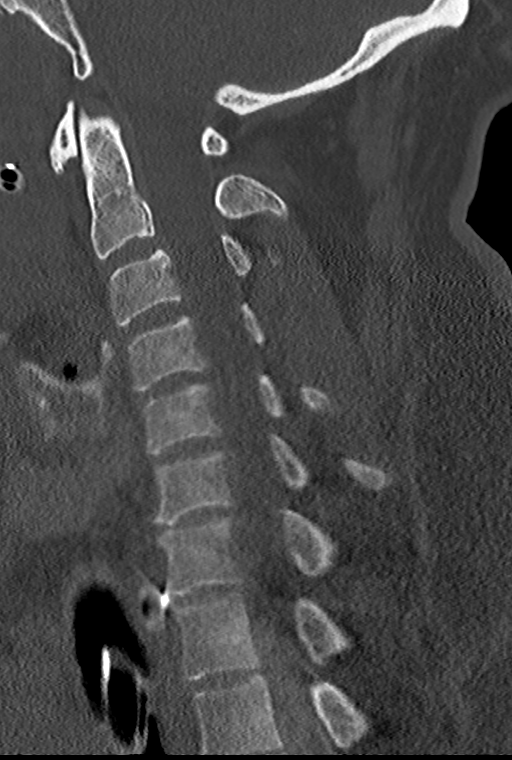
[im 41/61  bone]
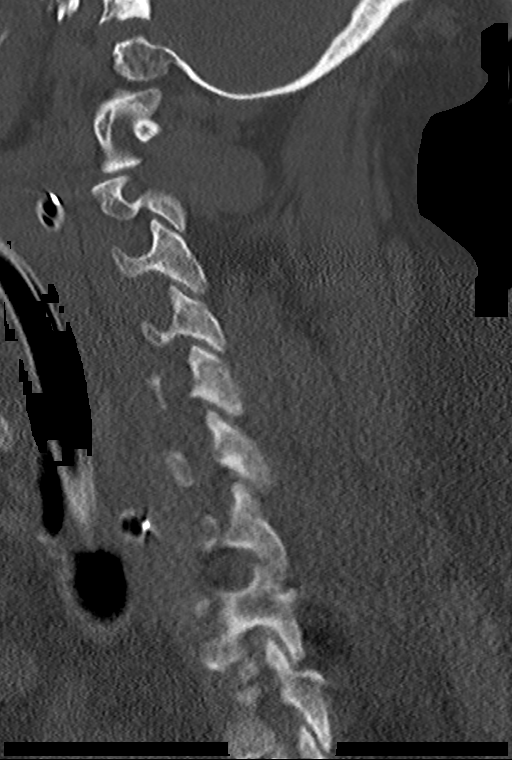
[im 51/61  bone]
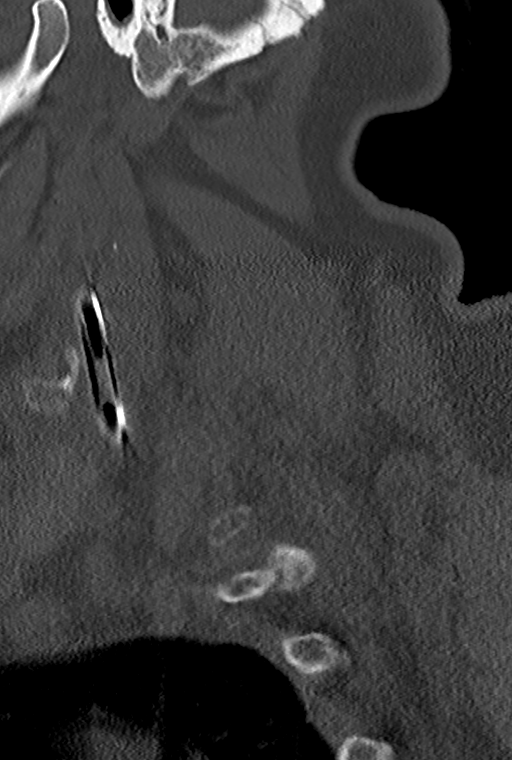

[Series 15: orthogonal axial st · axial · 0.21mm/px · z∈[-255,-214]mm · 2 of 70 slices shown]
[im 24/70  bone]
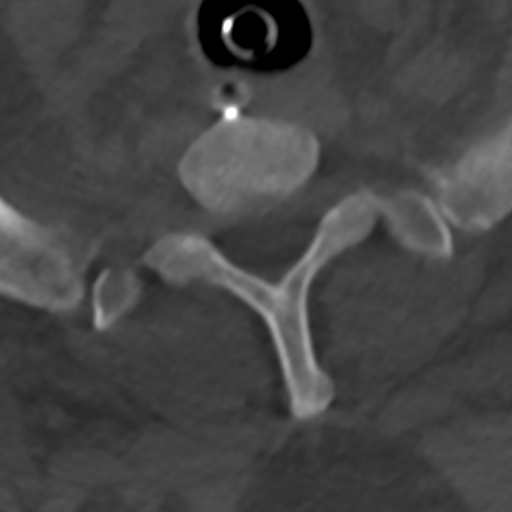
[im 47/70  bone]
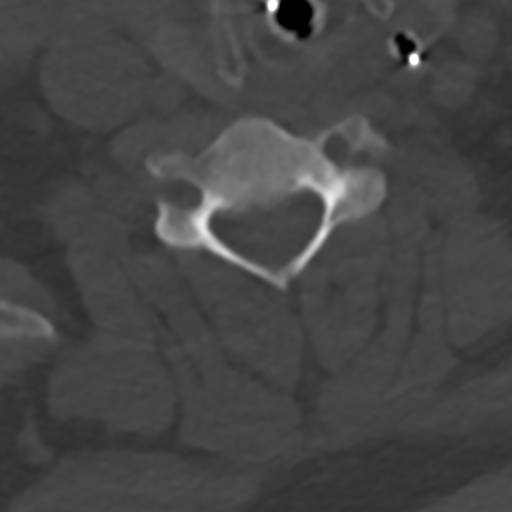

[13 of 33 positions shown; findings below may reference images not displayed]

FINDINGS: CT HEAD FINDINGS

Brain: Diffuse edema with loss of gray-white matter distinction
consistent with anoxic brain injury. No hemorrhage is noted.
Slit-like ventricles are noted likely from adjacent edema.

Vascular: Relative hyperdense appearance of intracranial vessels
likely due to hypodensity surrounding brain parenchyma. No
intracranial hemorrhage.

Skull: Negative

Sinuses/Orbits: Ethmoid and maxillary sinus mucosal thickening.
Intact orbits and globes.

Other: None

CT CERVICAL SPINE FINDINGS

Alignment: Intact craniocervical relationship and atlantodental
interval. Reversal cervical lordosis from positioning.

Skull base and vertebrae: Negative for fracture or bone destruction.

Soft tissues and spinal canal: Prevertebral soft tissues are
difficult to assess due to endotracheal and gastric tube placement.
No intraspinal hemorrhage.

Disc levels: No focal disc herniation, jumped or perched facets. No
significant neural foraminal encroachment.

Upper chest: Negative

Other: None
IMPRESSION: 1. Findings consistent with anoxic brain injury with diffuse loss of
gray- white matter distinction.
2. No acute cervical spine abnormality.
